# Patient Record
Sex: Female | Born: 1940 | Race: White | Hispanic: No | Marital: Married | Smoking: Never smoker
Health system: Southern US, Community
[De-identification: ages and names within clinical notes are randomized; demographics above are authoritative.]

## PROBLEM LIST (undated history)

## (undated) DIAGNOSIS — M8580 Other specified disorders of bone density and structure, unspecified site: Secondary | ICD-10-CM

## (undated) DIAGNOSIS — E785 Hyperlipidemia, unspecified: Secondary | ICD-10-CM

## (undated) DIAGNOSIS — E079 Disorder of thyroid, unspecified: Secondary | ICD-10-CM

## (undated) DIAGNOSIS — T7840XA Allergy, unspecified, initial encounter: Secondary | ICD-10-CM

## (undated) DIAGNOSIS — C801 Malignant (primary) neoplasm, unspecified: Secondary | ICD-10-CM

## (undated) DIAGNOSIS — R011 Cardiac murmur, unspecified: Secondary | ICD-10-CM

## (undated) DIAGNOSIS — Z5189 Encounter for other specified aftercare: Secondary | ICD-10-CM

## (undated) DIAGNOSIS — K219 Gastro-esophageal reflux disease without esophagitis: Secondary | ICD-10-CM

## (undated) DIAGNOSIS — I639 Cerebral infarction, unspecified: Secondary | ICD-10-CM

## (undated) DIAGNOSIS — M1990 Unspecified osteoarthritis, unspecified site: Secondary | ICD-10-CM

## (undated) DIAGNOSIS — H269 Unspecified cataract: Secondary | ICD-10-CM

## (undated) HISTORY — PX: POLYPECTOMY: SHX149

## (undated) HISTORY — DX: Hyperlipidemia, unspecified: E78.5

## (undated) HISTORY — DX: Cerebral infarction, unspecified: I63.9

## (undated) HISTORY — DX: Unspecified osteoarthritis, unspecified site: M19.90

## (undated) HISTORY — DX: Allergy, unspecified, initial encounter: T78.40XA

## (undated) HISTORY — DX: Gastro-esophageal reflux disease without esophagitis: K21.9

## (undated) HISTORY — DX: Other specified disorders of bone density and structure, unspecified site: M85.80

## (undated) HISTORY — DX: Disorder of thyroid, unspecified: E07.9

## (undated) HISTORY — PX: PARTIAL HYSTERECTOMY: SHX80

## (undated) HISTORY — DX: Malignant (primary) neoplasm, unspecified: C80.1

## (undated) HISTORY — DX: Cardiac murmur, unspecified: R01.1

## (undated) HISTORY — DX: Unspecified cataract: H26.9

## (undated) HISTORY — DX: Encounter for other specified aftercare: Z51.89

---
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
## 1967-12-18 HISTORY — PX: ABDOMINAL HYSTERECTOMY: SHX81

## 2006-07-18 ENCOUNTER — Other Ambulatory Visit: Admission: RE | Admit: 2006-07-18 | Discharge: 2006-07-18 | Payer: Self-pay | Admitting: Obstetrics and Gynecology

## 2006-07-22 ENCOUNTER — Ambulatory Visit: Payer: Self-pay | Admitting: Internal Medicine

## 2006-08-05 ENCOUNTER — Ambulatory Visit: Payer: Self-pay | Admitting: Internal Medicine

## 2006-08-05 HISTORY — PX: COLONOSCOPY: SHX174

## 2010-08-25 ENCOUNTER — Encounter: Admission: RE | Admit: 2010-08-25 | Discharge: 2010-08-25 | Payer: Self-pay | Admitting: Family Medicine

## 2010-09-05 ENCOUNTER — Encounter: Admission: RE | Admit: 2010-09-05 | Discharge: 2010-09-05 | Payer: Self-pay | Admitting: Family Medicine

## 2011-03-16 DIAGNOSIS — E039 Hypothyroidism, unspecified: Secondary | ICD-10-CM | POA: Insufficient documentation

## 2011-03-16 DIAGNOSIS — E782 Mixed hyperlipidemia: Secondary | ICD-10-CM | POA: Insufficient documentation

## 2011-08-24 DIAGNOSIS — J309 Allergic rhinitis, unspecified: Secondary | ICD-10-CM | POA: Insufficient documentation

## 2011-08-27 ENCOUNTER — Other Ambulatory Visit: Payer: Self-pay | Admitting: Family Medicine

## 2011-08-27 DIAGNOSIS — Z1231 Encounter for screening mammogram for malignant neoplasm of breast: Secondary | ICD-10-CM

## 2011-09-07 ENCOUNTER — Ambulatory Visit
Admission: RE | Admit: 2011-09-07 | Discharge: 2011-09-07 | Disposition: A | Discharge status: Home / Self Care | Payer: Medicare Other | Source: Ambulatory Visit | Attending: Family Medicine | Admitting: Family Medicine

## 2011-09-07 DIAGNOSIS — Z1231 Encounter for screening mammogram for malignant neoplasm of breast: Secondary | ICD-10-CM

## 2012-09-05 ENCOUNTER — Other Ambulatory Visit: Payer: Self-pay | Admitting: Family Medicine

## 2012-09-05 DIAGNOSIS — Z1231 Encounter for screening mammogram for malignant neoplasm of breast: Secondary | ICD-10-CM

## 2012-09-05 DIAGNOSIS — K5790 Diverticulosis of intestine, part unspecified, without perforation or abscess without bleeding: Secondary | ICD-10-CM | POA: Insufficient documentation

## 2012-09-05 DIAGNOSIS — Z780 Asymptomatic menopausal state: Secondary | ICD-10-CM

## 2012-09-24 ENCOUNTER — Ambulatory Visit
Admission: RE | Admit: 2012-09-24 | Discharge: 2012-09-24 | Disposition: A | Discharge status: Home / Self Care | Payer: Medicare Other | Source: Ambulatory Visit | Attending: Family Medicine | Admitting: Family Medicine

## 2012-09-24 DIAGNOSIS — Z1231 Encounter for screening mammogram for malignant neoplasm of breast: Secondary | ICD-10-CM

## 2012-09-24 DIAGNOSIS — Z780 Asymptomatic menopausal state: Secondary | ICD-10-CM

## 2012-10-16 DIAGNOSIS — M8580 Other specified disorders of bone density and structure, unspecified site: Secondary | ICD-10-CM | POA: Insufficient documentation

## 2013-09-30 ENCOUNTER — Other Ambulatory Visit: Payer: Self-pay | Admitting: Family Medicine

## 2013-09-30 DIAGNOSIS — Z1231 Encounter for screening mammogram for malignant neoplasm of breast: Secondary | ICD-10-CM

## 2013-09-30 DIAGNOSIS — N393 Stress incontinence (female) (male): Secondary | ICD-10-CM | POA: Insufficient documentation

## 2013-09-30 DIAGNOSIS — M159 Polyosteoarthritis, unspecified: Secondary | ICD-10-CM | POA: Insufficient documentation

## 2013-10-16 ENCOUNTER — Ambulatory Visit
Admission: RE | Admit: 2013-10-16 | Discharge: 2013-10-16 | Disposition: A | Discharge status: Home / Self Care | Payer: Medicare Other | Source: Ambulatory Visit | Attending: Family Medicine | Admitting: Family Medicine

## 2013-10-16 DIAGNOSIS — Z1231 Encounter for screening mammogram for malignant neoplasm of breast: Secondary | ICD-10-CM

## 2014-11-08 ENCOUNTER — Other Ambulatory Visit: Payer: Self-pay | Admitting: Family Medicine

## 2014-11-08 DIAGNOSIS — M810 Age-related osteoporosis without current pathological fracture: Secondary | ICD-10-CM

## 2014-11-08 DIAGNOSIS — Z1231 Encounter for screening mammogram for malignant neoplasm of breast: Secondary | ICD-10-CM

## 2014-11-08 DIAGNOSIS — N951 Menopausal and female climacteric states: Secondary | ICD-10-CM | POA: Insufficient documentation

## 2014-11-08 DIAGNOSIS — M8580 Other specified disorders of bone density and structure, unspecified site: Secondary | ICD-10-CM

## 2014-11-29 ENCOUNTER — Ambulatory Visit
Admission: RE | Admit: 2014-11-29 | Discharge: 2014-11-29 | Disposition: A | Discharge status: Home / Self Care | Payer: Medicare Other | Source: Ambulatory Visit | Attending: Family Medicine | Admitting: Family Medicine

## 2014-11-29 DIAGNOSIS — Z1231 Encounter for screening mammogram for malignant neoplasm of breast: Secondary | ICD-10-CM

## 2014-11-29 DIAGNOSIS — M8580 Other specified disorders of bone density and structure, unspecified site: Secondary | ICD-10-CM

## 2015-11-08 ENCOUNTER — Other Ambulatory Visit: Payer: Self-pay

## 2015-11-08 DIAGNOSIS — Z1231 Encounter for screening mammogram for malignant neoplasm of breast: Secondary | ICD-10-CM

## 2015-12-01 ENCOUNTER — Ambulatory Visit
Admission: RE | Admit: 2015-12-01 | Discharge: 2015-12-01 | Disposition: A | Discharge status: Home / Self Care | Payer: Medicare Other | Source: Ambulatory Visit

## 2015-12-01 DIAGNOSIS — Z1231 Encounter for screening mammogram for malignant neoplasm of breast: Secondary | ICD-10-CM

## 2016-01-11 DIAGNOSIS — M26609 Unspecified temporomandibular joint disorder, unspecified side: Secondary | ICD-10-CM | POA: Insufficient documentation

## 2016-06-15 ENCOUNTER — Encounter: Payer: Self-pay | Admitting: Gastroenterology

## 2016-06-26 ENCOUNTER — Encounter: Payer: Self-pay | Admitting: Gastroenterology

## 2016-08-23 ENCOUNTER — Encounter (INDEPENDENT_AMBULATORY_CARE_PROVIDER_SITE_OTHER): Payer: Self-pay

## 2016-08-23 ENCOUNTER — Ambulatory Visit (AMBULATORY_SURGERY_CENTER): Payer: Self-pay | Admitting: *Deleted

## 2016-08-23 VITALS — Ht 59.0 in | Wt 124.0 lb

## 2016-08-23 DIAGNOSIS — Z1211 Encounter for screening for malignant neoplasm of colon: Secondary | ICD-10-CM

## 2016-08-23 MED ORDER — NA SULFATE-K SULFATE-MG SULF 17.5-3.13-1.6 GM/177ML PO SOLN: 1.0000 | Freq: Once | ORAL | 0 refills | Status: AC

## 2016-08-23 NOTE — Progress Notes (Signed)
No egg or soy allergy known to patient  No issues with past sedation with any surgeries  or procedures, no intubation problems  No diet pills per patient No home 02 use per patient  No blood thinners per patient  Pt denies issues with constipation  No A fib or A flutter   

## 2016-08-27 ENCOUNTER — Encounter: Payer: Self-pay | Admitting: Gastroenterology

## 2016-09-06 ENCOUNTER — Ambulatory Visit (AMBULATORY_SURGERY_CENTER): Payer: Medicare Other | Admitting: Gastroenterology

## 2016-09-06 ENCOUNTER — Encounter: Payer: Self-pay | Admitting: Gastroenterology

## 2016-09-06 VITALS — BP 129/67 | HR 76 | Temp 97.8°F | Resp 17 | Ht 59.0 in | Wt 124.0 lb

## 2016-09-06 DIAGNOSIS — D123 Benign neoplasm of transverse colon: Secondary | ICD-10-CM | POA: Diagnosis not present

## 2016-09-06 DIAGNOSIS — Z1211 Encounter for screening for malignant neoplasm of colon: Secondary | ICD-10-CM | POA: Diagnosis present

## 2016-09-06 MED ORDER — SODIUM CHLORIDE 0.9 % IV SOLN: 500.0000 mL | INTRAVENOUS | Status: DC

## 2016-09-06 NOTE — Progress Notes (Signed)
Called to room to assist during endoscopic procedure.  Patient ID and intended procedure confirmed with present staff. Received instructions for my participation in the procedure from the performing physician.  

## 2016-09-06 NOTE — Op Note (Signed)
[facility] Patient Name: Allison Roberson Procedure Date: 09/06/2016 11:34 AM MRN: [id number] Endoscopist: Lund. Loletha Carrow , MD Age: 75 Referring MD:  Date of Birth: May 26, 1941 Gender: Female Account #: [id number] Procedure:                Colonoscopy Indications:              Screening for colorectal malignant neoplasm (no                            polyps on 07/2006 colonoscopy) Medicines:                Monitored Anesthesia Care Procedure:                Pre-Anesthesia Assessment:                           - Prior to the procedure, a History and Physical                            was performed, and patient medications and                            allergies were reviewed. The patient's tolerance of                            previous anesthesia was also reviewed. The risks                            and benefits of the procedure and the sedation                            options and risks were discussed with the patient.                            All questions were answered, and informed consent                            was obtained. Prior Anticoagulants: The patient has                            taken no previous anticoagulant or antiplatelet                            agents. ASA Grade Assessment: II - A patient with                            mild systemic disease. After reviewing the risks                            and benefits, the patient was deemed in                            satisfactory condition to undergo the procedure.  After obtaining informed consent, the colonoscope                            was passed under direct vision. Throughout the                            procedure, the patient's blood pressure, pulse, and                            oxygen saturations were monitored continuously. The                            Model CF-HQ190L [id number]) scope was introduced                            through the anus and advanced to  the the cecum,                            identified by appendiceal orifice and ileocecal                            valve. The colonoscopy was performed with moderate                            difficulty due to multiple diverticula in the colon                            and a tortuous distal sigmoid colon. Successful                            completion of the procedure was aided by changing                            the patient to a supine position, withdrawing the                            scope and replacing with the pediatric colonoscope                            and applying abdominal pressure. The patient                            tolerated the procedure well. The quality of the                            bowel preparation was excellent. The ileocecal                            valve, appendiceal orifice, and rectum were                            photographed. The quality of the bowel preparation  was evaluated using the BBPS [facility] Bowel                            Preparation Scale) with scores of: Right Colon = 3,                            Transverse Colon = 3 and Left Colon = 2. The total                            BBPS score equals 8. The bowel preparation used was                            SUPREP. Scope In: 11:52:45 AM Scope Out: [id number] PM Scope Withdrawal Time: 0 hours 8 minutes 17 seconds  Total Procedure Duration: 0 hours 16 minutes 33 seconds  Findings:                 The perianal and digital rectal examinations were                            normal.                           A 10 x 4 mm polyp was found in the proximal                            transverse colon. The polyp was sessile. The polyp                            was removed with a hot snare. Resection and                            retrieval were complete.                           Many diverticula were found in the distal sigmoid                            colon.                            Internal hemorrhoids were found during                            retroflexion. The hemorrhoids were medium-sized and                            Grade I (internal hemorrhoids that do not prolapse).                           The exam was otherwise without abnormality. Complications:            No immediate complications. Estimated Blood Loss:     Estimated blood loss: none. Impression:               - One 10  x 4 mm polyp in the proximal transverse                            colon, removed with a hot snare. Resected and                            retrieved.                           - Diverticulosis in the distal sigmoid colon.                           - Internal hemorrhoids.                           - The examination was otherwise normal. Recommendation:           - Patient has a contact number available for                            emergencies. The signs and symptoms of potential                            delayed complications were discussed with the                            patient. Return to normal activities tomorrow.                            Written discharge instructions were provided to the                            patient.                           - Resume previous diet.                           - No aspirin, ibuprofen, naproxen, or other                            non-steroidal anti-inflammatory drugs for 5 days                            after polyp removal.                           - Await pathology results.                           - Repeat colonoscopy is recommended for                            surveillance. The colonoscopy date will be                            determined after pathology results from today's  exam become available for review. Henry L. Loletha Carrow, MD 09/06/2016 12:17:16 PM This report has been signed electronically.

## 2016-09-06 NOTE — Progress Notes (Signed)
A/ox3 pleased with MAC, report to Penny RN 

## 2016-09-06 NOTE — Patient Instructions (Signed)
YOU HAD AN ENDOSCOPIC PROCEDURE TODAY AT [facility]:   Refer to the procedure report that was given to you for any specific questions about what was found during the examination.  If the procedure report does not answer your questions, please call your gastroenterologist to clarify.  If you requested that your care partner not be given the details of your procedure findings, then the procedure report has been included in a sealed envelope for you to review at your convenience later.  YOU SHOULD EXPECT: Some feelings of bloating in the abdomen. Passage of more gas than usual.  Walking can help get rid of the air that was put into your GI tract during the procedure and reduce the bloating. If you had a lower endoscopy (such as a colonoscopy or flexible sigmoidoscopy) you may notice spotting of blood in your stool or on the toilet paper. If you underwent a bowel prep for your procedure, you may not have a normal bowel movement for a few days.  Please Note:  You might notice some irritation and congestion in your nose or some drainage.  This is from the oxygen used during your procedure.  There is no need for concern and it should clear up in a day or so.  SYMPTOMS TO REPORT IMMEDIATELY:   Following lower endoscopy (colonoscopy or flexible sigmoidoscopy):  Excessive amounts of blood in the stool  Significant tenderness or worsening of abdominal pains  Swelling of the abdomen that is new, acute  Fever of 100F or higher   Following upper endoscopy (EGD)  Vomiting of blood or coffee ground material  New chest pain or pain under the shoulder blades  Painful or persistently difficult swallowing  New shortness of breath  Fever of 100F or higher  Black, tarry-looking stools  For urgent or emergent issues, a gastroenterologist can be reached at any hour by calling [phone].   DIET:  We do recommend a small meal at first, but then you may proceed to your regular diet.  Drink  plenty of fluids but you should avoid alcoholic beverages for 24 hours.  ACTIVITY:  You should plan to take it easy for the rest of today and you should NOT DRIVE or use heavy machinery until tomorrow (because of the sedation medicines used during the test).    FOLLOW UP: Our staff will call the number listed on your records the next business day following your procedure to check on you and address any questions or concerns that you may have regarding the information given to you following your procedure. If we do not reach you, we will leave a message.  However, if you are feeling well and you are not experiencing any problems, there is no need to return our call.  We will assume that you have returned to your regular daily activities without incident.  If any biopsies were taken you will be contacted by phone or by letter within the next 1-3 weeks.  Please call us at [phone] if you have not heard about the biopsies in 3 weeks.    SIGNATURES/CONFIDENTIALITY: You and/or your care partner have signed paperwork which will be entered into your electronic medical record.  These signatures attest to the fact that that the information above on your After Visit Summary has been reviewed and is understood.  Full responsibility of the confidentiality of this discharge information lies with you and/or your care-partner.    Handouts were given to your care partner on polyps,  diverticulosis, and hemorrhoids. No aspirin, aspirin products,  ibuprofen, naproxen, advil, motrin, aleve, or other non-steroidal anti-inflammatory drugs for 5 days after polyp removal. You may resume your other current medications today. Await biopsy results. Please call if any questions or concerns.

## 2016-09-06 NOTE — Progress Notes (Signed)
No problems noted in the recovery room. maw 

## 2016-09-06 NOTE — Progress Notes (Signed)
Briefly used adult colonoscope the switched to pediatric

## 2016-09-07 ENCOUNTER — Telehealth: Payer: Self-pay

## 2016-09-07 NOTE — Telephone Encounter (Signed)
  Follow up Call-  Call back number 09/06/2016  Post procedure Call Back phone  # [phone]  Permission to leave phone message Yes  Some recent data might be hidden    Patient was called for follow up after her procedure on 09/06/2016. Patient reports that yesterday she experienced some nausea and vomiting. This morning she reports that she doesn't feel nauseated. Allison Roberson states that she has had some diarrhea this morning. I told the patient that it could be caused by the prep for the colonoscopy. I instructed Allison Roberson to call the 24 hour number should her symptoms persist. Allison Roberson did not report any pain.   Patient questions:  Do you have a fever, pain , or abdominal swelling? No. Pain Score  0 *  Have you tolerated food without any problems? No.  Have you been able to return to your normal activities? Yes.    Do you have any questions about your discharge instructions: Diet   No. Medications  No. Follow up visit  No.  Do you have questions or concerns about your Care? No.  Actions: * If pain score is 4 or above: No action needed, pain <4.

## 2016-09-14 ENCOUNTER — Encounter: Payer: Self-pay | Admitting: Gastroenterology

## 2016-10-30 ENCOUNTER — Other Ambulatory Visit: Payer: Self-pay | Admitting: Family Medicine

## 2016-10-30 DIAGNOSIS — Z1231 Encounter for screening mammogram for malignant neoplasm of breast: Secondary | ICD-10-CM

## 2016-12-05 ENCOUNTER — Ambulatory Visit
Admission: RE | Admit: 2016-12-05 | Discharge: 2016-12-05 | Disposition: A | Discharge status: Home / Self Care | Payer: Medicare Other | Source: Ambulatory Visit | Attending: Family Medicine | Admitting: Family Medicine

## 2016-12-05 DIAGNOSIS — Z1231 Encounter for screening mammogram for malignant neoplasm of breast: Secondary | ICD-10-CM

## 2017-02-14 DIAGNOSIS — R030 Elevated blood-pressure reading, without diagnosis of hypertension: Secondary | ICD-10-CM | POA: Insufficient documentation

## 2017-10-22 ENCOUNTER — Ambulatory Visit: Payer: Medicare Other | Admitting: Nurse Practitioner

## 2017-10-22 ENCOUNTER — Encounter: Payer: Self-pay | Admitting: Nurse Practitioner

## 2017-10-22 VITALS — BP 140/62 | HR 68 | Ht 59.0 in | Wt 123.6 lb

## 2017-10-22 DIAGNOSIS — R1310 Dysphagia, unspecified: Secondary | ICD-10-CM | POA: Diagnosis not present

## 2017-10-22 NOTE — Progress Notes (Signed)
Chief Complaint : Epigastric discomfort, chokes on food  HPI:  Patient is 76 yo female previously followed by Dr. Olevia Perches now Dr. Loletha Carrow. Over the last year patient has felt like food is sticking in epigastrium. Symptom occurs within 5 or so minutes into a meal and happens with almost every meal regardless of what she eats. Feels like she is choking on food at time. Liquids seem like they help food to pass and liquids alone go down okay but she can feel them going down the esophagus. Never been evaluated for this problems. No previous EGD. She has no heartburn but does have occasion reflux. No chest pain or shortness of breath. No weight loss. She otherwise feels okay.    Past Medical History:  Diagnosis Date  . Allergy   . Arthritis    hands  . Blood transfusion without reported diagnosis 1963   with child birth  . Cancer [facility])    uterine cancer - age 41   . Cataract    small, watching   . GERD (gastroesophageal reflux disease)   . Hyperlipidemia   . Osteopenia    gets prolia every 6 months   . Thyroid disease     Past Surgical History:  Procedure Laterality Date  . COLONOSCOPY  08/05/2006   tic, moderate   . PARTIAL HYSTERECTOMY     has ovaries   Family History  Problem Relation Age of Onset  . Colon polyps Son   . Colon cancer Neg Hx   . Rectal cancer Neg Hx   . Stomach cancer Neg Hx    Social History   Tobacco Use  . Smoking status: Never Smoker  . Smokeless tobacco: Never Used  Substance Use Topics  . Alcohol use: No  . Drug use: No   Current Outpatient Medications  Medication Sig Dispense Refill  . Ascorbic Acid (VITAMIN C) 1000 MG tablet Take by mouth.    . Cholecalciferol (VITAMIN D3) 400 units CAPS Take by mouth.    . denosumab (PROLIA) 60 MG/ML SOLN injection Inject into the skin.    . folic acid (FOLVITE) 825 MCG tablet Take by mouth.    . Garlic 0539 MG CAPS Take by mouth.    . levothyroxine (SYNTHROID, LEVOTHROID) 50 MCG tablet take 1 tablet  by mouth once daily    . Multiple Vitamins-Minerals (CENTRUM SILVER) tablet Take by mouth.    . Omega-3 Fatty Acids (FISH OIL) 1000 MG CAPS Take 1,000 mg by mouth daily.    . simvastatin (ZOCOR) 40 MG tablet take 1 tablet by mouth at bedtime    . vitamin E 400 UNIT capsule Take by mouth.    . Glucosamine-Chondroitin 250-200 MG CAPS Take by mouth.     Current Facility-Administered Medications  Medication Dose Route Frequency Provider Last Rate Last Dose  . 0.9 %  sodium chloride infusion  500 mL Intravenous Continuous Danis, Estill Cotta III, MD       No Known Allergies   Physical Exam: BP 140/62   Pulse 68   Ht 4\' 11"  (1.499 m)   Wt 123 lb 9.6 oz (56.1 kg)   BMI 24.96 kg/m  Constitutional:  Well-developed, white female in no acute distress. Psychiatric: Normal mood and affect. Behavior is normal. EENT: Pupils normal.  Conjunctivae are normal. No scleral icterus. Neck supple.  Cardiovascular: Normal rate, regular rhythm. No edema Pulmonary/chest: Effort normal and breath sounds normal. No wheezing, rales or rhonchi. Abdominal: Soft, nondistended. Nontender. Bowel sounds  active throughout. There are no masses palpable. No hepatomegaly. Lymphadenopathy: No cervical adenopathy noted. Neurological: Alert and oriented to person place and time. Skin: Skin is warm and dry. No rashes noted.   ASSESSMENT AND PLAN:  76 yo female with one year hx of sensation that food is sticking in the epigastrium. She could have an esophageal stricture.  -Will start with a barium swallow with tablet. Depending on results she may need an EGD with dilation. -Advised patient to eat small bites, chew well with liquids in between bites to avoid food impaction.     Tye Savoy, NP  10/22/2017, 2:31 PM

## 2017-10-22 NOTE — Patient Instructions (Signed)
If you are age 74 or older, your body mass index should be between 23-30. Your Body mass index is 24.96 kg/m. If this is out of the aforementioned range listed, please consider follow up with your Primary Care Provider.  If you are age 52 or younger, your body mass index should be between 19-25. Your Body mass index is 24.96 kg/m. If this is out of the aformentioned range listed, please consider follow up with your Primary Care Provider.   You have been scheduled for a Barium Esophogram at [facility] (1st floor of the hospital) on 10/29/17 at 130 pm. Please arrive 15 minutes prior to your appointment for registration. Make certain not to have anything to eat or drink 3 hours prior to your test. If you need to reschedule for any reason, please contact radiology at [phone] to do so. __________________________________________________________________ A barium swallow is an examination that concentrates on views of the esophagus. This tends to be a double contrast exam (barium and two liquids which, when combined, create a gas to distend the wall of the oesophagus) or single contrast (non-ionic iodine based). The study is usually tailored to your symptoms so a good history is essential. Attention is paid during the study to the form, structure and configuration of the esophagus, looking for functional disorders (such as aspiration, dysphagia, achalasia, motility and reflux) EXAMINATION You may be asked to change into a gown, depending on the type of swallow being performed. A radiologist and radiographer will perform the procedure. The radiologist will advise you of the type of contrast selected for your procedure and direct you during the exam. You will be asked to stand, sit or lie in several different positions and to hold a small amount of fluid in your mouth before being asked to swallow while the imaging is performed .In some instances you may be asked to swallow barium coated  marshmallows to assess the motility of a solid food bolus. The exam can be recorded as a digital or video fluoroscopy procedure. POST PROCEDURE It will take 1-2 days for the barium to pass through your system. To facilitate this, it is important, unless otherwise directed, to increase your fluids for the next 24-48hrs and to resume your normal diet.  This test typically takes about 30 minutes to perform. __________________________________________________________________________________ Will call with results.  Thank you for choosing me and [facility].   Tye Savoy, NP

## 2017-10-22 NOTE — Progress Notes (Signed)
Thank you for sending this case to me. I have reviewed the entire note, and the outlined plan seems appropriate.   Henry Danis, MD  

## 2017-10-29 ENCOUNTER — Ambulatory Visit (HOSPITAL_COMMUNITY)
Admission: RE | Admit: 2017-10-29 | Discharge: 2017-10-29 | Disposition: A | Discharge status: Home / Self Care | Payer: Medicare Other | Source: Ambulatory Visit | Attending: Nurse Practitioner | Admitting: Nurse Practitioner

## 2017-10-29 DIAGNOSIS — K449 Diaphragmatic hernia without obstruction or gangrene: Secondary | ICD-10-CM | POA: Insufficient documentation

## 2017-10-29 DIAGNOSIS — R1310 Dysphagia, unspecified: Secondary | ICD-10-CM | POA: Insufficient documentation

## 2017-10-29 DIAGNOSIS — K219 Gastro-esophageal reflux disease without esophagitis: Secondary | ICD-10-CM | POA: Insufficient documentation

## 2017-11-06 DIAGNOSIS — Z8542 Personal history of malignant neoplasm of other parts of uterus: Secondary | ICD-10-CM | POA: Insufficient documentation

## 2017-11-12 ENCOUNTER — Ambulatory Visit (AMBULATORY_SURGERY_CENTER): Payer: Self-pay

## 2017-11-12 ENCOUNTER — Other Ambulatory Visit: Payer: Self-pay

## 2017-11-12 VITALS — Ht 59.0 in | Wt 120.6 lb

## 2017-11-12 DIAGNOSIS — R1310 Dysphagia, unspecified: Secondary | ICD-10-CM

## 2017-11-12 NOTE — Progress Notes (Signed)
Denies allergies to eggs or soy products. Denies complication of anesthesia or sedation. Denies use of weight loss medication. Denies use of O2.   Emmi instructions declined.  

## 2017-11-13 ENCOUNTER — Encounter: Payer: Self-pay | Admitting: Gastroenterology

## 2017-11-22 ENCOUNTER — Other Ambulatory Visit: Payer: Self-pay

## 2017-11-22 ENCOUNTER — Ambulatory Visit (AMBULATORY_SURGERY_CENTER): Payer: Medicare Other | Admitting: Gastroenterology

## 2017-11-22 ENCOUNTER — Encounter: Payer: Self-pay | Admitting: Gastroenterology

## 2017-11-22 VITALS — BP 118/68 | HR 94 | Temp 97.3°F | Resp 14 | Ht 59.0 in | Wt 123.0 lb

## 2017-11-22 DIAGNOSIS — R1310 Dysphagia, unspecified: Secondary | ICD-10-CM | POA: Diagnosis present

## 2017-11-22 DIAGNOSIS — K222 Esophageal obstruction: Secondary | ICD-10-CM

## 2017-11-22 MED ORDER — SODIUM CHLORIDE 0.9 % IV SOLN: 500.0000 mL | Freq: Once | INTRAVENOUS | Status: DC

## 2017-11-22 NOTE — Progress Notes (Signed)
To recovery, report to RN, VSS. 

## 2017-11-22 NOTE — Patient Instructions (Signed)
YOU HAD AN ENDOSCOPIC PROCEDURE TODAY AT [facility]:   Refer to the procedure report that was given to you for any specific questions about what was found during the examination.  If the procedure report does not answer your questions, please call your gastroenterologist to clarify.  If you requested that your care partner not be given the details of your procedure findings, then the procedure report has been included in a sealed envelope for you to review at your convenience later.  YOU SHOULD EXPECT: Some feelings of bloating in the abdomen. Passage of more gas than usual.  Walking can help get rid of the air that was put into your GI tract during the procedure and reduce the bloating.   Please Note:  You might notice some irritation and congestion in your nose or some drainage.  This is from the oxygen used during your procedure.  There is no need for concern and it should clear up in a day or so.  SYMPTOMS TO REPORT IMMEDIATELY:    Following upper endoscopy (EGD)  Vomiting of blood or coffee ground material  New chest pain or pain under the shoulder blades  Painful or persistently difficult swallowing  New shortness of breath  Fever of 100F or higher  Black, tarry-looking stools  For urgent or emergent issues, a gastroenterologist can be reached at any hour by calling [phone].   DIET:  We do recommend clear liquids for an hour, and then a soft diet for the rest of the day. You may have a regular diet tomorrow.  Drink plenty of fluids but you should avoid alcoholic beverages for 24 hours.  ACTIVITY:  You should plan to take it easy for the rest of today and you should NOT DRIVE or use heavy machinery until tomorrow (because of the sedation medicines used during the test).    FOLLOW UP: Our staff will call the number listed on your records the next business day following your procedure to check on you and address any questions or concerns that you may have  regarding the information given to you following your procedure. If we do not reach you, we will leave a message.  However, if you are feeling well and you are not experiencing any problems, there is no need to return our call.  We will assume that you have returned to your regular daily activities without incident.   SIGNATURES/CONFIDENTIALITY: You and/or your care partner have signed paperwork which will be entered into your electronic medical record.  These signatures attest to the fact that that the information above on your After Visit Summary has been reviewed and is understood.  Full responsibility of the confidentiality of this discharge information lies with you and/or your care-partner.  Read all of the handouts given to you by your recovery room nurse.

## 2017-11-22 NOTE — Progress Notes (Signed)
Called to room to assist during endoscopic procedure.  Patient ID and intended procedure confirmed with present staff. Received instructions for my participation in the procedure from the performing physician.  

## 2017-11-22 NOTE — Op Note (Signed)
[facility] Patient Name: Cadience Bradfield Procedure Date: 11/22/2017 1:46 PM MRN: [id number] Endoscopist: Dufur. Loletha Carrow , MD Age: 76 Referring MD:  Date of Birth: [birth year] Gender: Female Account #: [id number] Procedure:                Upper GI endoscopy Indications:              Dysphagia Medicines:                Monitored Anesthesia Care Procedure:                Pre-Anesthesia Assessment:                           - Prior to the procedure, a History and Physical                            was performed, and patient medications and                            allergies were reviewed. The patient's tolerance of                            previous anesthesia was also reviewed. The risks                            and benefits of the procedure and the sedation                            options and risks were discussed with the patient.                            All questions were answered, and informed consent                            was obtained. Prior Anticoagulants: The patient has                            taken no previous anticoagulant or antiplatelet                            agents. ASA Grade Assessment: II - A patient with                            mild systemic disease. After reviewing the risks                            and benefits, the patient was deemed in                            satisfactory condition to undergo the procedure.                           After obtaining informed consent, the endoscope was  passed under direct vision. Throughout the                            procedure, the patient's blood pressure, pulse, and                            oxygen saturations were monitored continuously. The                            Endoscope was introduced through the mouth, and                            advanced to the second part of duodenum. The upper                            GI endoscopy was accomplished without difficulty.                             The patient tolerated the procedure well. Scope In: Scope Out: Findings:                 A small hiatal hernia was present.                           - There was moderate resistance passing the scope                            through the UES, indicative of hypertensive                            sphincter. No stricture was seen in the proximal                            esophagus.                           One moderate benign-appearing, intrinsic stenosis                            was found at the gastroesophageal junction. This                            measured 1.1 cm (inner diameter) x less than one cm                            (in length) and was traversed. A guidewire was                            placed and the scope was withdrawn. Dilation was                            performed with a Savary dilator with mild  resistance at 12 mm and moderate resistance at 15                            mm. Repeat esophagoscopy confirmed good response to                            dilation.                           The stomach was normal.                           The cardia and gastric fundus were normal on                            retroflexion.                           The examined duodenum was normal. Complications:            No immediate complications. Estimated Blood Loss:     Estimated blood loss was minimal. Impression:               - Normal larynx.                           - Small hiatal hernia.                           - Benign-appearing esophageal stenosis. Dilated.                           - Normal stomach.                           - Normal examined duodenum.                           - No specimens collected. Recommendation:           - Patient has a contact number available for                            emergencies. The signs and symptoms of potential                            delayed complications were  discussed with the                            patient. Return to normal activities tomorrow.                            Written discharge instructions were provided to the                            patient.                           - Resume previous  diet.                           - Continue present medications. Jaysen Wey L. Loletha Carrow, MD 11/22/2017 2:22:53 PM This report has been signed electronically.

## 2017-11-22 NOTE — Progress Notes (Signed)
Pt's states no medical or surgical changes since previsit or office visit. 

## 2017-11-27 ENCOUNTER — Telehealth: Payer: Self-pay | Admitting: *Deleted

## 2017-11-27 NOTE — Telephone Encounter (Signed)
  Follow up Call-  Call back number 11/22/2017 09/06/2016  Post procedure Call Back phone  # [phone]  Permission to leave phone message Yes Yes  Some recent data might be hidden     Patient questions:  Do you have a fever, pain , or abdominal swelling? No. Pain Score  0 *  Have you tolerated food without any problems? Yes.    Have you been able to return to your normal activities? Yes.    Do you have any questions about your discharge instructions: Diet   No. Medications  No. Follow up visit  No.  Do you have questions or concerns about your Care? No.  Actions: * If pain score is 4 or above: No action needed, pain <4.  PT C/O SORE THROAT BUT SHE STATES ITS GETTING BETTER. ENCOURAGED PT TO CALL us BACK IF IT WORSENS.

## 2018-01-20 ENCOUNTER — Other Ambulatory Visit: Payer: Self-pay | Admitting: Family Medicine

## 2018-01-20 DIAGNOSIS — Z139 Encounter for screening, unspecified: Secondary | ICD-10-CM

## 2018-02-07 ENCOUNTER — Ambulatory Visit
Admission: RE | Admit: 2018-02-07 | Discharge: 2018-02-07 | Disposition: A | Discharge status: Home / Self Care | Payer: Medicare Other | Source: Ambulatory Visit | Attending: Family Medicine | Admitting: Family Medicine

## 2018-02-07 DIAGNOSIS — Z139 Encounter for screening, unspecified: Secondary | ICD-10-CM

## 2018-02-17 ENCOUNTER — Ambulatory Visit (INDEPENDENT_AMBULATORY_CARE_PROVIDER_SITE_OTHER): Payer: Medicare Other | Admitting: Family Medicine

## 2018-02-17 ENCOUNTER — Encounter: Payer: Self-pay | Admitting: Family Medicine

## 2018-02-17 VITALS — BP 124/82 | HR 84 | Temp 98.1°F | Resp 12 | Ht 59.0 in | Wt 119.1 lb

## 2018-02-17 DIAGNOSIS — R1310 Dysphagia, unspecified: Secondary | ICD-10-CM

## 2018-02-17 DIAGNOSIS — E039 Hypothyroidism, unspecified: Secondary | ICD-10-CM | POA: Diagnosis not present

## 2018-02-17 DIAGNOSIS — M85852 Other specified disorders of bone density and structure, left thigh: Secondary | ICD-10-CM | POA: Diagnosis not present

## 2018-02-17 DIAGNOSIS — E782 Mixed hyperlipidemia: Secondary | ICD-10-CM

## 2018-02-17 DIAGNOSIS — Z5181 Encounter for therapeutic drug level monitoring: Secondary | ICD-10-CM

## 2018-02-17 NOTE — Assessment & Plan Note (Signed)
I recommend Nexium 20 mg twice daily, she does not want me to send a prescription because she can get it cheaper OTC. GERD precautions to continue. Recommend following with GI, Dr. Marlou Sa, if symptoms are not improved in 4-6 weeks.

## 2018-02-17 NOTE — Progress Notes (Signed)
HPI:   [name] is a 77 y.o. female, who is here today to establish care.  Former PCP: Dr Allison Roberson Last preventive routine visit: 01/2017  Chronic medical problems: Hypothyroidism, osteoporosis, hyperlipidemia, and pre-hypertension among some.  She lives with her husband. She exercises regularly and follows a healthy diet.  Concerns today: She is due for her Prolia today.  She has been on biphosphonate in the past. She is now on Prolia and has been taking it for about 5 years. Before Prolia was started she had osteoporosis of left hip, -2.3. She is reporting last DEXA in 2018 at her gynecologist office. I see DEXA in 2015, osteopenia of left hip.   Comprehensive metabolic panel on 0/12/6008 Noted hyperCa++  Comprehensive metabolic panel  Component Value Ref Range Performed At  Glucose 84 65 - 99 mg/dL [facility]  BUN 10 8 - 27 mg/dL [facility]  Creatinine, Serum 0.88 0.57 - 1.00 mg/dL [facility]  eGFR If NonAfrican American 65 >59 mL/min/1.73 [facility]  eGFR If African American 75 >59 mL/min/1.73 [facility]  BUN/Creatinine Ratio 11 (L) 12 - 28 [facility]  Sodium 143 134 - 144 mmol/L [facility]  Potassium 3.7 3.5 - 5.2 mmol/L [facility]  Chloride 98 96 - 106 mmol/L [facility]  CO2 25 18 - 29 mmol/L [facility]  CALCIUM 10.6 (H) 8.7 - 10.3 mg/dL [facility]  Total Protein 8.1 6.0 - 8.5 g/dL [facility]  Albumin, Serum 5.0 (H) 3.5 - 4.8 g/dL [facility]  Globulin, Total 3.1 1.5 - 4.5 g/dL [facility]  Albumin/Globulin Ratio 1.6 1.2 - 2.2 [facility]  Total Bilirubin 0.8 0.0 - 1.2 mg/dL [facility]  Alkaline Phosphatase 65 39 - 117 IU/L [facility]  AST 25 0 - 40 IU/L [facility]  ALT (SGPT) 18       Hypothyroidism:  Currently she is on Levothyroxine 50 mcg daily.. Tolerating medication well, no side effects reported. She has not noted palpitations, abdominal pain, changes in bowel habits, tremor, cold/heat intolerance, or abnormal weight loss.  Mild wt gain.  Last TSH in 01/2017  was normal at 2.6.   Dysphagia,GERD:  She is also reporting dysphagia for solids and liquids. She follows with GI, Dr. Marlou Roberson.  EGD 11/2017 show a small hiatal hernia, intrinsic stenosis at the gastroesophageal junction.   Esophageal dilation was performed, she is reporting not improvement of dysphagia. Currently she is not on PPI treatment. She takes Rolaids for heartburn.   Denies abdominal pain, nausea, vomiting, changes in bowel habits, blood in stool or melena.   Hyperlipidemia:  Currently on Simvastatin 40 mg daily. Following a low fat diet: Yes.  She has noted lower extremity achy muscles, mainly thighs. It improved when she decreased Simvastatin dose to 20 mg but not resolved. She denies edema or erythema.  Lipid panel on 01/18/2017 Lipid panel  Component Value Ref Range Performed At  Cholesterol, Total 189 100 - 199 mg/dL [facility]  Triglycerides 189 (H) 0 - 149 mg/dL [facility]  HDL 68 >39 mg/dL [facility]  VLDL Cholesterol Cal 38 5 - 40 mg/dL [facility]  LDL Calculated 83       Review of Systems  Constitutional: Negative for activity change, appetite change, fatigue and fever.  HENT: Positive for trouble swallowing. Negative for mouth sores, nosebleeds and sore throat.   Eyes: Negative for redness and visual disturbance.  Respiratory: Negative for cough, shortness of breath and wheezing.  Cardiovascular: Negative for chest pain, palpitations and leg swelling.  Gastrointestinal: Negative for abdominal pain, blood in stool, nausea and vomiting.       Negative for changes in bowel habits.  Endocrine: Negative for cold intolerance and heat intolerance.  Genitourinary: Negative for decreased urine volume, dysuria and hematuria.  Musculoskeletal: Positive for myalgias. Negative for gait problem.  Skin: Negative for rash.  Allergic/Immunologic: Positive for environmental allergies.  Neurological: Negative for syncope, weakness and headaches.  Hematological: Negative  for adenopathy. Does not bruise/bleed easily.  Psychiatric/Behavioral: Negative for confusion. The patient is not nervous/anxious.       Current Outpatient Medications on File Prior to Visit  Medication Sig Dispense Refill  . Ascorbic Acid (VITAMIN C) 1000 MG tablet Take by mouth.    . Cholecalciferol (VITAMIN D3) 400 units CAPS Take by mouth.    . denosumab (PROLIA) 60 MG/ML SOLN injection Inject into the skin.    . folic acid (FOLVITE) 170 MCG tablet Take by mouth.    . Garlic 0174 MG CAPS Take by mouth.    . Glucosamine-Chondroitin 250-200 MG CAPS Take by mouth.    . levothyroxine (SYNTHROID, LEVOTHROID) 50 MCG tablet take 1 tablet by mouth once daily    . Multiple Vitamins-Minerals (CENTRUM SILVER) tablet Take by mouth.    . Omega-3 Fatty Acids (FISH OIL) 1000 MG CAPS Take 1,000 mg by mouth daily.    . vitamin E 400 UNIT capsule Take by mouth.     Current Facility-Administered Medications on File Prior to Visit  Medication Dose Route Frequency Provider Last Rate Last Dose  . 0.9 %  sodium chloride infusion  500 mL Intravenous Continuous Danis, Allison L III, MD      . 0.9 %  sodium chloride infusion  500 mL Intravenous Once Allison Stabler, MD         Past Medical History:  Diagnosis Date  . Allergy   . Arthritis    hands  . Blood transfusion without reported diagnosis 1963   with child birth  . Cancer [facility])    uterine cancer - age 45   . Cataract    small, watching   . GERD (gastroesophageal reflux disease)   . Hyperlipidemia   . Osteopenia    gets prolia every 6 months   . Thyroid disease    No Known Allergies  Family History  Problem Relation Age of Onset  . Colon polyps Son   . Colon cancer Neg Hx   . Rectal cancer Neg Hx   . Stomach cancer Neg Hx   . Pancreatic cancer Neg Hx     Social History   Socioeconomic History  . Marital status: Married    Spouse name: None  . Number of children: 2  . Years of education: None  . Highest education level:  None  Social Needs  . Financial resource strain: None  . Food insecurity - worry: None  . Food insecurity - inability: None  . Transportation needs - medical: None  . Transportation needs - non-medical: None  Occupational History  . None  Tobacco Use  . Smoking status: Never Smoker  . Smokeless tobacco: Never Used  Substance and Sexual Activity  . Alcohol use: No  . Drug use: No  . Sexual activity: No  Other Topics Concern  . None  Social History Narrative  . None    Vitals:   02/17/18 1513  BP: 124/82  Pulse: 84  Resp: 12  Temp:  98.1 F (36.7 C)  SpO2: 99%    Body mass index is 24.06 kg/m.   Physical Exam  Nursing note and vitals reviewed. Constitutional: She is oriented to person, place, and time. She appears well-developed and well-nourished. No distress.  HENT:  Head: Normocephalic and atraumatic.  Mouth/Throat: Oropharynx is clear and moist and mucous membranes are normal.  Eyes: Conjunctivae are normal. Pupils are equal, round, and reactive to light.  Neck: No tracheal deviation present. No thyroid mass and no thyromegaly present.  Cardiovascular: Normal rate and regular rhythm.  No murmur heard. Pulses:      Dorsalis pedis pulses are 2+ on the right side, and 2+ on the left side.  Respiratory: Effort normal and breath sounds normal. No respiratory distress.  GI: Soft. She exhibits no mass. There is no hepatomegaly. There is no tenderness.  Musculoskeletal: She exhibits no edema or tenderness.  Lymphadenopathy:    She has no cervical adenopathy.  Neurological: She is alert and oriented to person, place, and time. She has normal strength. Gait normal.  Skin: Skin is warm. No erythema.  Psychiatric: Her mood appears anxious.  Well groomed, good eye contact.     ASSESSMENT AND PLAN:   Ms. Mirren was seen today for establish care.  Orders Placed This Encounter  Procedures  . Basic metabolic panel  . TSH  . VITAMIN D 25 Hydroxy (Vit-D Deficiency,  Fractures)  . Parathyroid hormone, intact (no Ca)  . CBC with Differential/Platelet  . Calcium, ionized    Osteopenia She has responded well to Prolia. She is due for Prolia today, she requested injection but I prefer to wait until we have lab results. Continue calcium and vitamin D supplementation. Fall prevention. She is due for DEXA in 2020.   Mixed hyperlipidemia Because Simvastatin is causing myalgias of lower extremities, recommend discontinuing for now. We will consider other statin depending of lab results done today. Options: Pravastatin, Lovastatin, or Crestor. Continue low-fat diet.  Acquired hypothyroidism No changes in current management, will follow [place] done today and will give further recommendations accordingly. Follow-up in 6-12 months.   Dysphagia I recommend Nexium 20 mg twice daily, she does not want me to send a prescription because she can get it cheaper OTC. GERD precautions to continue. Recommend following with GI, Dr. Marlou Roberson, if symptoms are not improved in 4-6 weeks.     Medication monitoring encounter -     Basic metabolic panel -     VITAMIN D 25 Hydroxy (Vit-D Deficiency, Fractures) -     Parathyroid hormone, intact (no Ca)  Hypercalcemia  Further recommendations will be given according to lab results.        Shernita Rabinovich G. Martinique, MD  [facility]. [place] office.

## 2018-02-17 NOTE — Patient Instructions (Addendum)
A few things to remember from today's visit:   Acquired hypothyroidism - Plan: TSH, VITAMIN D 25 Hydroxy (Vit-D Deficiency, Fractures), Parathyroid hormone, intact (no Ca)  Dysphagia, unspecified type  Medication monitoring encounter - Plan: Basic metabolic panel, VITAMIN D 25 Hydroxy (Vit-D Deficiency, Fractures), Parathyroid hormone, intact (no Ca)  Nexium 20 mg 2 times per day recommended. We will need to schedule Prolia injection, somebody will call you in a few days.  Continue calcium and vitamin D.  Please follow with gastroenterologist if dysphagia continues.  For now we will stop simvastatin and we can try a different medication depending on cholesterol results.  Please be sure medication list is accurate. If a new problem present, please set up appointment sooner than planned today.

## 2018-02-17 NOTE — Assessment & Plan Note (Signed)
She has responded well to Prolia. She is due for Prolia today, she requested injection but I prefer to wait until we have lab results. Continue calcium and vitamin D supplementation. Fall prevention. She is due for DEXA in 2020.

## 2018-02-17 NOTE — Assessment & Plan Note (Signed)
No changes in current management, will follow [place] done today and will give further recommendations accordingly. Follow-up in 6-12 months.

## 2018-02-17 NOTE — Assessment & Plan Note (Signed)
Because Simvastatin is causing myalgias of lower extremities, recommend discontinuing for now. We will consider other statin depending of lab results done today. Options: Pravastatin, Lovastatin, or Crestor. Continue low-fat diet.

## 2018-02-18 LAB — CBC WITH DIFFERENTIAL/PLATELET
BASOS ABS: 0.1 10*3/uL (ref 0.0–0.1)
Basophils Relative: 1.1 % (ref 0.0–3.0)
Eosinophils Absolute: 0.2 10*3/uL (ref 0.0–0.7)
Eosinophils Relative: 2.4 % (ref 0.0–5.0)
HEMATOCRIT: 45.5 % (ref 36.0–46.0)
HEMOGLOBIN: 15.6 g/dL — AB (ref 12.0–15.0)
LYMPHS PCT: 45.6 % (ref 12.0–46.0)
Lymphs Abs: 3.9 10*3/uL (ref 0.7–4.0)
MCHC: 34.4 g/dL (ref 30.0–36.0)
MCV: 94.2 fl (ref 78.0–100.0)
MONOS PCT: 11 % (ref 3.0–12.0)
Monocytes Absolute: 0.9 10*3/uL (ref 0.1–1.0)
NEUTROS ABS: 3.4 10*3/uL (ref 1.4–7.7)
Neutrophils Relative %: 39.9 % — ABNORMAL LOW (ref 43.0–77.0)
Platelets: 197 10*3/uL (ref 150.0–400.0)
RBC: 4.83 Mil/uL (ref 3.87–5.11)
RDW: 13.2 % (ref 11.5–15.5)
WBC: 8.5 10*3/uL (ref 4.0–10.5)

## 2018-02-18 LAB — PARATHYROID HORMONE, INTACT (NO CA): PTH: 24 pg/mL (ref 14–64)

## 2018-02-18 LAB — VITAMIN D 25 HYDROXY (VIT D DEFICIENCY, FRACTURES): VITD: 58.02 ng/mL (ref 30.00–100.00)

## 2018-02-18 LAB — BASIC METABOLIC PANEL
BUN: 11 mg/dL (ref 6–23)
CHLORIDE: 100 meq/L (ref 96–112)
CO2: 32 meq/L (ref 19–32)
CREATININE: 0.72 mg/dL (ref 0.40–1.20)
Calcium: 10.4 mg/dL (ref 8.4–10.5)
GFR: 84.05 mL/min (ref >60.00)
Glucose, Bld: 84 mg/dL (ref 70–99)
POTASSIUM: 4.1 meq/L (ref 3.5–5.1)
Sodium: 140 mEq/L (ref 135–145)

## 2018-02-18 LAB — TSH: TSH: 2.35 u[IU]/mL (ref 0.35–4.50)

## 2018-02-18 LAB — CALCIUM, IONIZED: Calcium, Ion: 5.3 mg/dL (ref 4.8–5.6)

## 2018-02-26 ENCOUNTER — Other Ambulatory Visit: Payer: Self-pay | Admitting: Family Medicine

## 2018-02-26 NOTE — Telephone Encounter (Signed)
Copied from [place] [id number]. Topic: Quick Communication - Rx Refill/Question >> Feb 26, 2018  3:21 PM Lolita Rieger, [place] wrote: Medication: levothyroxine 50 mg,simvastatin 40 mg   Has the patient contacted their pharmacy? yes   (Agent: If no, request that the patient contact the pharmacy for the refill.)   Preferred Pharmacy (with phone number or street name): [facility] in [place]    Agent: Please be advised that RX refills may take up to 3 business days. We ask that you follow-up with your pharmacy.

## 2018-02-27 NOTE — Telephone Encounter (Signed)
Last office visit; 02/17/18 (establish care) PCP Dr. Martinique [facility]: [facility] in [place]  Pt. requesting Levothyroxine and Simvastatin. See OV of 3/4; Dr. Martinique noted plan to d/c Simvastatin, and change to different medication.

## 2018-02-28 MED ORDER — LEVOTHYROXINE SODIUM 50 MCG PO TABS: 50.0000 ug | ORAL_TABLET | Freq: Every day | ORAL | 3 refills | Status: DC

## 2018-04-02 ENCOUNTER — Ambulatory Visit (INDEPENDENT_AMBULATORY_CARE_PROVIDER_SITE_OTHER): Payer: Medicare Other

## 2018-04-02 DIAGNOSIS — M810 Age-related osteoporosis without current pathological fracture: Secondary | ICD-10-CM | POA: Diagnosis not present

## 2018-04-02 MED ORDER — DENOSUMAB 60 MG/ML ~~LOC~~ SOSY
60.0000 mg | PREFILLED_SYRINGE | Freq: Once | SUBCUTANEOUS | Status: AC
  Administered 2018-04-02: 60 mg via SUBCUTANEOUS

## 2018-10-03 ENCOUNTER — Telehealth: Payer: Self-pay

## 2018-10-03 ENCOUNTER — Ambulatory Visit: Payer: Medicare Other

## 2018-10-03 NOTE — Telephone Encounter (Signed)
Allison Roberson, DOB: 1941-12-05 came to our office today and stated that she got confirmation that her Prolia was approved from the company. I did not see her name on any of the boxes we have in the fridge. I advised to her that [name] will contact her once we receive the confirmation information. Patient is upset because she stated that the delay will not allow her to have her injections on time. I advised patient that the cost will be around $2000 if we give her the injection and do not have approval yet. I also apologized to the patient for any inconvenience. Patient verbalized an understanding and is awaiting a call to have her Prolia injection.

## 2018-10-08 NOTE — Telephone Encounter (Signed)
Appointment scheduled for Prolia injection Cost approximately $260

## 2018-10-10 ENCOUNTER — Ambulatory Visit (INDEPENDENT_AMBULATORY_CARE_PROVIDER_SITE_OTHER): Payer: Medicare Other | Admitting: *Deleted

## 2018-10-10 DIAGNOSIS — M810 Age-related osteoporosis without current pathological fracture: Secondary | ICD-10-CM

## 2018-10-10 MED ORDER — DENOSUMAB 60 MG/ML ~~LOC~~ SOSY
60.0000 mg | PREFILLED_SYRINGE | Freq: Once | SUBCUTANEOUS | Status: AC
  Administered 2018-10-10: 60 mg via SUBCUTANEOUS

## 2018-10-10 NOTE — Progress Notes (Signed)
Per orders of Dr. Martinique, injection of Prolia, given by Agnes Lawrence. Patient tolerated injection well.

## 2019-02-02 ENCOUNTER — Other Ambulatory Visit: Payer: Self-pay | Admitting: Family Medicine

## 2019-03-18 ENCOUNTER — Telehealth: Payer: Self-pay | Admitting: *Deleted

## 2019-03-18 NOTE — Telephone Encounter (Signed)
Copied from [place] [phone]. Topic: General - Other >> Mar 17, 2019  9:00 PM Oneta Rack wrote: Patient LVM stating she's due for her prolia injection. Patient last office visit 02/17/18 with PCP. Please advise

## 2019-04-24 NOTE — Telephone Encounter (Signed)
Patient scheduled for Prolia injection 04/29/2019. Patient is aware of the cost - 20% of Prolia and $35 office fee.

## 2019-04-29 ENCOUNTER — Other Ambulatory Visit: Payer: Self-pay

## 2019-04-29 ENCOUNTER — Encounter: Payer: Self-pay | Admitting: Family Medicine

## 2019-04-29 ENCOUNTER — Ambulatory Visit (INDEPENDENT_AMBULATORY_CARE_PROVIDER_SITE_OTHER): Payer: Medicare Other | Admitting: *Deleted

## 2019-04-29 ENCOUNTER — Ambulatory Visit (INDEPENDENT_AMBULATORY_CARE_PROVIDER_SITE_OTHER): Payer: Medicare Other | Admitting: Family Medicine

## 2019-04-29 DIAGNOSIS — N390 Urinary tract infection, site not specified: Secondary | ICD-10-CM

## 2019-04-29 DIAGNOSIS — R300 Dysuria: Secondary | ICD-10-CM

## 2019-04-29 DIAGNOSIS — R350 Frequency of micturition: Secondary | ICD-10-CM

## 2019-04-29 DIAGNOSIS — M810 Age-related osteoporosis without current pathological fracture: Secondary | ICD-10-CM

## 2019-04-29 LAB — POC URINALSYSI DIPSTICK (AUTOMATED)
Blood, UA: NEGATIVE
Glucose, UA: NEGATIVE
Protein, UA: NEGATIVE
Spec Grav, UA: >=1.03 — AB (ref 1.010–1.025)
Urobilinogen, UA: 2 E.U./dL — AB
pH, UA: 5 (ref 5.0–8.0)

## 2019-04-29 MED ORDER — DENOSUMAB 60 MG/ML ~~LOC~~ SOSY
60.0000 mg | PREFILLED_SYRINGE | Freq: Once | SUBCUTANEOUS | Status: AC
  Administered 2019-04-29: 14:00:00 60 mg via SUBCUTANEOUS

## 2019-04-29 MED ORDER — SULFAMETHOXAZOLE-TRIMETHOPRIM 800-160 MG PO TABS: 1.0000 | ORAL_TABLET | Freq: Two times a day (BID) | ORAL | 0 refills | Status: AC

## 2019-04-29 NOTE — Progress Notes (Signed)
Virtual Visit via Telephone Note  I connected with Allison Roberson on 04/29/19 at  3:00 PM EDT by telephone and verified that I am speaking with the correct person using two identifiers.   I discussed the limitations, risks, security and privacy concerns of performing an evaluation and management service by telephone and the availability of in person appointments. I also discussed with the patient that there may be a patient responsible charge related to this service. The patient expressed understanding and agreed to proceed.  Location patient: home Location provider: home office Participants present for the call: patient, provider Patient did not have a visit in the prior 7 days to address this/these issue(s).   History of Present Illness: She is a 78 year old female complaining of 3 weeks of intermittent urinary symptoms. She is having dysuria, frequency, and urgency. She denies urine incontinence, gross hematuria, vaginal discharge, or vaginal bleeding.  She has had UTIs in the past, not very often. Denies fever, chills, body aches, abdominal pain, back pain, nausea, or vomiting.  She has taking OTC Azo, which has helped with dysuria. Symptoms otherwise stable.    Observations/Objective: Patient sounds cheerful and well on the phone. I do not appreciate any SOB. Speech and thought processing are grossly intact. Patient reported vitals:N/A  Assessment and Plan:  1. Frequent urination - POCT Urinalysis Dipstick (Automated) - Urine culture - sulfamethoxazole-trimethoprim (BACTRIM DS) 800-160 MG tablet; Take 1 tablet by mouth 2 (two) times daily for 5 days.  Dispense: 10 tablet; Refill: 0  2. Urinary tract infection without hematuria, site unspecified - Urine culture - sulfamethoxazole-trimethoprim (BACTRIM DS) 800-160 MG tablet; Take 1 tablet by mouth 2 (two) times daily for 5 days.  Dispense: 10 tablet; Refill: 0  Urine dipstick was done earlier today, positive for  nitrates and leukocytes. Urine also positive for ketones and urobilinogen.  She has not noted jaundice, color changes in urine or stool.  Possible causes of reported symptoms discussed. Ucx ordered.  Empiric abx treatment started today and will be tailored according to Ucx results and susceptibility report.  Clearly instructed about warning signs. F/U if symptoms persist.  Follow Up Instructions: We will follow urine culture. Planning on checking LFTs with next blood work. 02/2017 LFT in normal range.  I did not refer this patient for an OV in the next 24 hours for this/these issue(s).  I discussed the assessment and treatment plan with the patient. She was provided an opportunity to ask questions and all were answered. The patient agreed with the plan and demonstrated an understanding of the instructions.   The patient was advised to call back or seek an in-person evaluation if the symptoms worsen or if the condition fails to improve as anticipated.  I provided 15 minutes of non-face-to-face time during this encounter.   Derhonda Eastlick Martinique, MD

## 2019-04-29 NOTE — Progress Notes (Addendum)
Per orders of Dr. Hernandez, injection of Prolia given by Edris Friedt. Patient tolerated injection well. 

## 2019-04-30 LAB — URINE CULTURE
MICRO NUMBER:: 470777
SPECIMEN QUALITY:: ADEQUATE

## 2019-05-22 ENCOUNTER — Telehealth: Payer: Self-pay | Admitting: *Deleted

## 2019-05-22 NOTE — Telephone Encounter (Signed)
I saw her recently because UTI symptoms. I cannot see ER visit nor labs or imaging (if any) done during visit.  If symptoms are is stable, I will be glad to see her next week.  If she wants a referral to pulmonologist because of shortness of breath, it can be placed,even though this seems to be a new problem.  Thanks, BJ

## 2019-05-22 NOTE — Telephone Encounter (Signed)
Message sent to Dr. Jordan for review. 

## 2019-05-22 NOTE — Telephone Encounter (Signed)
Patient has not been seen by Dr. Elease Hashimoto.

## 2019-05-22 NOTE — Telephone Encounter (Signed)
Was this supposed to be routed to Dr Martinique? Dr Doug Sou patient.

## 2019-05-22 NOTE — Telephone Encounter (Signed)
Copied from [place] [phone] [phone]. Topic: General - Other >> May 22, 2019 10:06 AM Jodie Echevaria wrote: Reason for CRM: Patient called to request that Dr Elease Hashimoto call him back in reference to his wife being in the hospital for 4 days because she was having trouble breathing. He states that the hospital have ran all kinds of tests on her but cant find anything wrong with her. Asking if Dr can please get her scheduled for a Pulmonologist. Her husband states that the reason she went to the hospital was because she was unconscious and not breathing. Michela Pitcher that the family was told that patient has a slight lung infection other than that they cant find any other issues. Please call patient husband or daughter Ph# [phone] or [phone] [place]

## 2019-05-24 MED ORDER — Medication
Status: DC
Start: ? — End: 2019-05-24

## 2019-05-24 MED ORDER — BARO-CAT PO
10.00 | ORAL | Status: DC
Start: ? — End: 2019-05-24

## 2019-05-24 MED ORDER — PHENOL EX
20.00 | CUTANEOUS | Status: DC
Start: 2019-05-23 — End: 2019-05-24

## 2019-05-24 MED ORDER — CVS EAR DROPS OT
40.00 | OTIC | Status: DC
Start: 2019-05-24 — End: 2019-05-24

## 2019-05-24 MED ORDER — Medication
1.00 | Status: DC
Start: 2019-05-24 — End: 2019-05-24

## 2019-05-24 MED ORDER — GUAIFENESIN 100 MG/5ML PO LIQD
200.00 | ORAL | Status: DC
Start: ? — End: 2019-05-24

## 2019-05-24 MED ORDER — VICON FORTE PO CAPS
17.00 | ORAL_CAPSULE | ORAL | Status: DC
Start: ? — End: 2019-05-24

## 2019-05-24 MED ORDER — RA COLD/COUGH DM 15-1-5 MG/5ML PO ELIX
80.00 | ORAL_SOLUTION | ORAL | Status: DC
Start: 2019-05-23 — End: 2019-05-24

## 2019-05-24 MED ORDER — PENTOXIFYLLINE POWD
50.00 | Status: DC
Start: 2019-05-23 — End: 2019-05-24

## 2019-05-24 MED ORDER — ALBUTEROL SULFATE (2.5 MG/3ML) 0.083% IN NEBU
2.50 | INHALATION_SOLUTION | RESPIRATORY_TRACT | Status: DC
Start: ? — End: 2019-05-24

## 2019-05-24 MED ORDER — ALUM & MAG HYDROXIDE-SIMETH 200-200-20 MG/5ML PO SUSP
30.00 | ORAL | Status: DC
Start: ? — End: 2019-05-24

## 2019-05-24 MED ORDER — ASPIRIN 81 MG PO CHEW
81.00 | CHEWABLE_TABLET | ORAL | Status: DC
Start: 2019-05-24 — End: 2019-05-24

## 2019-05-24 MED ORDER — CHLOROBUTANOL-EUGENOL & APAP
0.40 | Status: DC
Start: ? — End: 2019-05-24

## 2019-05-26 NOTE — Telephone Encounter (Signed)
Spoke with patient and she stated she is feeling much better. Patient stated that she believe her symptoms were because she had pneumonia. Patient declined ov and pulmonary referral at this time. Will call office if she has any more issues.

## 2019-08-08 ENCOUNTER — Encounter: Payer: Self-pay | Admitting: Gastroenterology

## 2019-09-18 ENCOUNTER — Other Ambulatory Visit: Payer: Self-pay | Admitting: *Deleted

## 2019-09-18 ENCOUNTER — Telehealth: Payer: Self-pay | Admitting: *Deleted

## 2019-09-18 DIAGNOSIS — Z789 Other specified health status: Secondary | ICD-10-CM

## 2019-09-18 NOTE — Telephone Encounter (Signed)
Copied from [place] [phone]. Topic: Referral - Request for Referral >> Sep 17, 2019  8:33 AM Carolyn Stare wrote: Pt call to say she need a referral to Dr Delice Lesch neurologist she was in the hospital had a mild stroke

## 2019-09-21 ENCOUNTER — Encounter: Payer: Self-pay | Admitting: Neurology

## 2019-09-24 ENCOUNTER — Telehealth: Payer: Self-pay | Admitting: Family Medicine

## 2019-09-24 NOTE — Telephone Encounter (Signed)
Copied from [place] [phone]. Topic: Referral - Request for Referral >> Sep 09, 2019  9:11 AM Richardo Priest, NT wrote: Has patient seen PCP for this complaint?  No and unaware *If NO, is insurance requiring patient see PCP for this issue before PCP can refer them? Referral for which specialty: neurology Preferred provider/office: NA Reason for referral: patient went to hospital in June and had mini strokes. >> Sep 09, 2019 10:25 AM Cox, Melburn Hake, CMA wrote: Pt needs OV to discuss. Please schedule.   Patient is already scheduled for neuro on 10/14/2019 at 10:10

## 2019-10-13 NOTE — Progress Notes (Addendum)
NEUROLOGY CONSULTATION NOTE  Allison Roberson MRN: [id number] DOB: 08-May-1941  Referring provider: Betty Martinique, MD Primary care provider: Betty Martinique, MD  Reason for consult:  stroke  HISTORY OF PRESENT ILLNESS: Allison Roberson is a 78 year old right-handed white female with hyperlipidemia, TMJ dysfunction, thyroid disease and remote history of uterine cancer who presents for stroke.  History supplemented by hospital and referring provider's notes.  On 05/20/2019, she was sleeping in bed when her husband heard change in breathing.  He was unable to wake her up and he couldn't.  No associated shaking, incontinence or tongue biting.  EMS was contacted and she was brought to [facility].  In the ED, she appeared confused and did not remember the episode.  CBC, electrolytes, troponin and EKG were unremarkable.  She was admitted for further evaluation.  She had an apnea link performed which did not show any episodes of apnea but her O2 saturation dropped below 80% several times.  CT chest showed questionable atelectasis versus infiltrate in upper lobes.  She did not exhibit any fever or upper respiratory symptoms but was started on empiric IV antibiotics and Lasix.  She demonstrated no further nocturnal hypoxia.  CT of head was negative for acute intracranial abnormality.  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  No definitive diagnosis was established.  She was discharged with home oxygen and recommendation for outpatient sleep study and neurology follow up.  She has not had any recurrent events.   She takes ASA 81mg  twice a week.     PAST MEDICAL HISTORY: Past Medical History:  Diagnosis Date  . Allergy   . Arthritis    hands  . Blood transfusion without reported diagnosis 1963   with child birth  . Cancer [facility])     uterine cancer - age 26   . Cataract    small, watching   . GERD (gastroesophageal reflux disease)   . Hyperlipidemia   . Osteopenia    gets prolia every 6 months   . Thyroid disease     PAST SURGICAL HISTORY: Past Surgical History:  Procedure Laterality Date  . ABDOMINAL HYSTERECTOMY  1969  . COLONOSCOPY  08/05/2006   tic, moderate   . PARTIAL HYSTERECTOMY     has ovaries    MEDICATIONS: Current Outpatient Medications on File Prior to Visit  Medication Sig Dispense Refill  . Ascorbic Acid (VITAMIN C) 1000 MG tablet Take by mouth.    . Cholecalciferol (VITAMIN D3) 400 units CAPS Take by mouth.    . denosumab (PROLIA) 60 MG/ML SOLN injection Inject into the skin.    . folic acid (FOLVITE) A999333 MCG tablet Take by mouth.    . Garlic 123XX123 MG CAPS Take by mouth.    . Glucosamine-Chondroitin 250-200 MG CAPS Take by mouth.    . levothyroxine (SYNTHROID, LEVOTHROID) 50 MCG tablet TAKE 1 TABLET(50 MCG) BY MOUTH DAILY 90 tablet 3  . Multiple Vitamins-Minerals (CENTRUM SILVER) tablet Take by mouth.    . Omega-3 Fatty Acids (FISH OIL) 1000 MG CAPS Take 1,000 mg by mouth daily.    . vitamin E 400 UNIT capsule Take by mouth.     Current Facility-Administered Medications on File Prior to Visit  Medication Dose Route Frequency Provider Last Rate Last Dose  . 0.9 %  sodium chloride infusion  500 mL Intravenous Continuous Danis,  Estill Cotta III, MD      . 0.9 %  sodium chloride infusion  500 mL Intravenous Once Danis, Kirke Corin, MD        ALLERGIES: No Known Allergies  FAMILY HISTORY: Family History  Problem Relation Age of Onset  . Colon polyps Son   . Colon cancer Neg Hx   . Rectal cancer Neg Hx   . Stomach cancer Neg Hx   . Pancreatic cancer Neg Hx   .  SOCIAL HISTORY: Social History   Socioeconomic History  . Marital status: Married    Spouse name: Not on file  . Number of children: 2  . Years of education: Not on file  . Highest education level: Not on file   Occupational History  . Not on file  Social Needs  . Financial resource strain: Not on file  . Food insecurity    Worry: Not on file    Inability: Not on file  . Transportation needs    Medical: Not on file    Non-medical: Not on file  Tobacco Use  . Smoking status: Never Smoker  . Smokeless tobacco: Never Used  Substance and Sexual Activity  . Alcohol use: No  . Drug use: No  . Sexual activity: Never  Lifestyle  . Physical activity    Days per week: Not on file    Minutes per session: Not on file  . Stress: Not on file  Relationships  . Social Herbalist on phone: Not on file    Gets together: Not on file    Attends religious service: Not on file    Active member of club or organization: Not on file    Attends meetings of clubs or organizations: Not on file    Relationship status: Not on file  . Intimate partner violence    Fear of current or ex partner: Not on file    Emotionally abused: Not on file    Physically abused: Not on file    Forced sexual activity: Not on file  Other Topics Concern  . Not on file  Social History Narrative  . Not on file    REVIEW OF SYSTEMS: Constitutional: No fevers, chills, or sweats, no generalized fatigue, change in appetite Eyes: No visual changes, double vision, eye pain Ear, nose and throat: No hearing loss, ear pain, nasal congestion, sore throat Cardiovascular: No chest pain, palpitations Respiratory:  No shortness of breath at rest or with exertion, wheezes GastrointestinaI: No nausea, vomiting, diarrhea, abdominal pain, fecal incontinence Genitourinary:  No dysuria, urinary retention or frequency Musculoskeletal:  No neck pain, back pain Integumentary: No rash, pruritus, skin lesions Neurological: as above Psychiatric: No depression, insomnia, anxiety Endocrine: No palpitations, fatigue, diaphoresis, mood swings, change in appetite, change in weight, increased thirst Hematologic/Lymphatic:  No purpura,  petechiae. Allergic/Immunologic: no itchy/runny eyes, nasal congestion, recent allergic reactions, rashes  PHYSICAL EXAM: Blood pressure (!) 149/60, pulse 76, height 4\' 11"  (1.499 m), weight 113 lb 12.8 oz (51.6 kg), SpO2 97 %.  General: No acute distress.  Patient appears well-groomed.  Head:  Normocephalic/atraumatic Eyes:  fundi examined but not visualized Neck: supple, no paraspinal tenderness, full range of motion Back: No paraspinal tenderness Heart: regular rate and rhythm Lungs: Clear to auscultation bilaterally. Vascular: No carotid bruits. Neurological Exam: Mental status: alert and oriented to person, place, and time, recent and remote memory intact, fund of knowledge intact, attention and concentration intact, speech fluent and not dysarthric, language  intact. Cranial nerves: CN I: not tested CN II: pupils equal, round and reactive to light, visual fields intact CN III, IV, VI:  full range of motion, no nystagmus, no ptosis CN V: facial sensation intact CN VII: upper and lower face symmetric CN VIII: hearing intact CN IX, X: gag intact, uvula midline CN XI: sternocleidomastoid and trapezius muscles intact CN XII: tongue midline Bulk & Tone: normal, no fasciculations. Motor:  5/5 throughout  Sensation: temperature and vibration sensation intact. Deep Tendon Reflexes:  2+ throughout, toes downgoing.  Finger to nose testing:  Without dysmetria.  Heel to shin:  Without dysmetria.  Gait:  Normal station and stride.  Able to turn and tandem walk. Romberg negative.  IMPRESSION: 1.  Episode of hypoxia and unresponsiveness.  I don't think it was a CVA.  I would like to further evaluate with EEG. 2.  Cerebrovascular disease with findings of chronic cerebellar infarcts on brain MRI, incidental finding  PLAN: 1.  EEG 2.  Recommend taking ASA 81mg  daily and optimize stroke risk factors (blood pressure, cholesterol) 3.  Further recommendations pending EEG results.  If  unremarkable, follow up as needed.  Thank you for allowing me to take part in the care of this patient.  Metta Clines, DO  CC:  Betty Martinique, MD  ADDENDUM 11/02/2019:  Routine EEG from 10/19/2019 showed intermittent left temporal sharply-contoured slowing but no clear epileptiform discharges.  A 24 hour ambulatory EEG from 10/28/2019 to 10/29/2019 was normal.   Metta Clines, DO

## 2019-10-14 ENCOUNTER — Encounter: Payer: Self-pay | Admitting: Neurology

## 2019-10-14 ENCOUNTER — Other Ambulatory Visit: Payer: Self-pay

## 2019-10-14 ENCOUNTER — Ambulatory Visit: Payer: Medicare Other | Admitting: Neurology

## 2019-10-14 VITALS — BP 149/60 | HR 76 | Ht 59.0 in | Wt 113.8 lb

## 2019-10-14 DIAGNOSIS — R4020 Unspecified coma: Secondary | ICD-10-CM | POA: Diagnosis not present

## 2019-10-14 DIAGNOSIS — I679 Cerebrovascular disease, unspecified: Secondary | ICD-10-CM | POA: Diagnosis not present

## 2019-10-14 NOTE — Patient Instructions (Signed)
I don't know what the event was, but it was not a stroke.  The findings of stroke on MRI are old.    To further evaluate the episode in June, we will check a routine EEG.  Further recommendations pending results.  Given the evidence of old strokes on MRI, recommend taking aspirin 81mg  daily and make sure blood pressure and cholesterol are controlled.

## 2019-10-19 ENCOUNTER — Ambulatory Visit (INDEPENDENT_AMBULATORY_CARE_PROVIDER_SITE_OTHER): Payer: Medicare Other | Admitting: Neurology

## 2019-10-19 ENCOUNTER — Other Ambulatory Visit: Payer: Self-pay

## 2019-10-19 DIAGNOSIS — R4020 Unspecified coma: Secondary | ICD-10-CM | POA: Diagnosis not present

## 2019-10-22 ENCOUNTER — Telehealth: Payer: Self-pay | Admitting: Neurology

## 2019-10-22 NOTE — Telephone Encounter (Signed)
Patient called to check on EEG Results. Thank you

## 2019-10-23 ENCOUNTER — Telehealth: Payer: Self-pay

## 2019-10-23 DIAGNOSIS — R4020 Unspecified coma: Secondary | ICD-10-CM

## 2019-10-23 NOTE — Telephone Encounter (Signed)
Called spoke with patient she was informed that results are not back yet but will call once reviewed with results.

## 2019-10-23 NOTE — Telephone Encounter (Signed)
-----   Message from Pieter Partridge, DO sent at 10/23/2019 12:56 PM EST ----- EEG shows some slowing on the left side of her brain.  For a more thorough evaluation, I would like to order a 24 hour ambulatory EEG.  I would like her to make a follow up appointment with me afterwards to discuss (virtual visit is okay)

## 2019-10-23 NOTE — Procedures (Addendum)
ELECTROENCEPHALOGRAM REPORT  Date of Study: 10/19/2019  Patient's Name: Allison Roberson MRN: [id number] Date of Birth: [birth year]  Referring Provider: Metta Clines, DO  Clinical History: 78 year old woman with episode of hypoxia and unresponsiveness.  Medications: VITAMIN C 1000 MG tablet VITAMIN D3 400 units CAPS PROLIA 60 MG/ML SOLN injection FOLVITE A999333 MCG tablet Garlic 123XX123 MG CAPS SYNTHROID, LEVOTHROID 50 MCG tablet CENTRUM SILVER tablet FISH OIL 1000 MG CAPS vitamin E 400 UNIT capsule  Technical Summary: A multichannel digital EEG recording measured by the international 10-20 system with electrodes applied with paste and impedances below 5000 ohms performed in our laboratory with EKG monitoring in an awake and asleep patient.  Hyperventilation not performed as patient wearing mask for COVID pandemic.  Photic stimulation was performed.  The digital EEG was referentially recorded, reformatted, and digitally filtered in a variety of bipolar and referential montages for optimal display.    Description: The patient is awake and asleep during the recording.  During maximal wakefulness, there is a symmetric, medium voltage 11 Hz posterior dominant rhythm that attenuates with eye opening.  There is intermittent sharply contoured theta in the left temporal region without epileptiform morphology.  During drowsiness and sleep, there is an increase in theta slowing of the background.  Vertex waves and symmetric sleep spindles were seen.  Photic stimulation did not elicit any abnormalities.  There were no epileptiform discharges or electrographic seizures seen.    EKG lead was unremarkable.  Impression: This awake and asleep EEG is abnormal due to intermittent left temporal sharply-contoured slowing.  No clear epileptiform discharges seen.    Clinical Correlation: Above findings suggestive of a structural or physiologic abnormality in the left temporal region.     Metta Clines, DO

## 2019-10-23 NOTE — Telephone Encounter (Signed)
Called spoke with patient she was informed of results. Order place for AMB 24 HOUR EEG. Pt aware to make appt with Dr. Tomi Likens to f/u to discuss results at EEG has been complete.

## 2019-10-28 ENCOUNTER — Ambulatory Visit (INDEPENDENT_AMBULATORY_CARE_PROVIDER_SITE_OTHER): Payer: Medicare Other | Admitting: Neurology

## 2019-10-28 ENCOUNTER — Other Ambulatory Visit: Payer: Self-pay

## 2019-10-28 DIAGNOSIS — R4020 Unspecified coma: Secondary | ICD-10-CM

## 2019-11-02 ENCOUNTER — Telehealth: Payer: Self-pay

## 2019-11-02 NOTE — Procedures (Signed)
ELECTROENCEPHALOGRAM REPORT  Dates of Recording: 10/28/2019 at 11:18 AM to 10/29/2019 at 12:26 PM  Patient's Name: Allison Roberson MRN: [id number] Date of Birth: 17-Oct-1941  Referring Provider: Metta Clines  Procedure: 24-hour ambulatory EEG  History: 78 year old female with episode of hypoxia and unresponsiveness out of sleep.  Medications:  VITAMIN C 1000 MG tablet VITAMIN D3 400 units CAPS PROLIA 60 MG/ML SOLN injection FOLVITE A999333 MCG tablet Garlic 123XX123 MG CAPS SYNTHROID, LEVOTHROID 50 MCG tablet CENTRUM SILVER tablet FISH OIL 1000 MG CAPS vitamin E 400 UNIT capsule  Technical Summary: This is a 24-hour multichannel digital EEG recording measured by the international 10-20 system with electrodes applied with paste and impedances below 5000 ohms performed as portable with EKG monitoring.  The digital EEG was referentially recorded, reformatted, and digitally filtered in a variety of bipolar and referential montages for optimal display.    DESCRIPTION OF RECORDING: During maximal wakefulness, the background activity consisted of a symmetric 11Hz  posterior dominant rhythm which was reactive to eye opening.  There were no epileptiform discharges or focal slowing seen in wakefulness.  During the recording, the patient progresses through wakefulness, drowsiness, and Stage 2 sleep.  Again, there were no epileptiform discharges seen.  Events:  There were no electrographic seizures seen.  EKG lead was unremarkable.  IMPRESSION: This 24-hour ambulatory EEG study is normal.    CLINICAL CORRELATION: A normal EEG does not exclude a clinical diagnosis of epilepsy.  If further clinical questions remain, inpatient video EEG monitoring may be helpful.   Metta Clines, DO

## 2019-11-02 NOTE — Telephone Encounter (Signed)
Called spoke with she was informed of results. And understands

## 2019-11-02 NOTE — Telephone Encounter (Signed)
-----   Message from Pieter Partridge, DO sent at 11/02/2019 12:00 PM EST ----- 24 hour ambulatory EEG was normal.  At this point, I have no further recommendations.  She should follow up with her PCP.

## 2019-12-01 ENCOUNTER — Telehealth: Payer: Self-pay

## 2019-12-01 NOTE — Telephone Encounter (Signed)
Copied from [place] [id number]. Topic: General - Other >> Dec 01, 2019  4:03 PM Rainey Pines A wrote: Patient wants to know how long she needs to get her prolia injections for. Please advise

## 2019-12-08 NOTE — Telephone Encounter (Signed)
Usually long term (years), as far as she is tolerating medication well. It could be discontinued when DEXA is greatly improved,if she does not want to take medication forever. Also if side effects present or cost goes up. Thanks, BJ

## 2019-12-08 NOTE — Telephone Encounter (Signed)
Will discuss with pt at her cpe tomorrow.

## 2019-12-09 ENCOUNTER — Telehealth: Payer: Self-pay | Admitting: *Deleted

## 2019-12-09 ENCOUNTER — Ambulatory Visit (INDEPENDENT_AMBULATORY_CARE_PROVIDER_SITE_OTHER): Payer: Medicare Other | Admitting: Family Medicine

## 2019-12-09 ENCOUNTER — Encounter: Payer: Self-pay | Admitting: Family Medicine

## 2019-12-09 ENCOUNTER — Other Ambulatory Visit: Payer: Self-pay

## 2019-12-09 VITALS — BP 128/78 | HR 78 | Temp 95.5°F | Resp 16 | Ht 59.0 in | Wt 116.0 lb

## 2019-12-09 DIAGNOSIS — E782 Mixed hyperlipidemia: Secondary | ICD-10-CM | POA: Diagnosis not present

## 2019-12-09 DIAGNOSIS — Z5181 Encounter for therapeutic drug level monitoring: Secondary | ICD-10-CM

## 2019-12-09 DIAGNOSIS — E039 Hypothyroidism, unspecified: Secondary | ICD-10-CM

## 2019-12-09 DIAGNOSIS — Z0000 Encounter for general adult medical examination without abnormal findings: Secondary | ICD-10-CM

## 2019-12-09 DIAGNOSIS — Z780 Asymptomatic menopausal state: Secondary | ICD-10-CM

## 2019-12-09 DIAGNOSIS — M85852 Other specified disorders of bone density and structure, left thigh: Secondary | ICD-10-CM

## 2019-12-09 LAB — BASIC METABOLIC PANEL
BUN: 17 mg/dL (ref 6–23)
CO2: 31 mEq/L (ref 19–32)
Calcium: 10.9 mg/dL — ABNORMAL HIGH (ref 8.4–10.5)
Chloride: 101 mEq/L (ref 96–112)
Creatinine, Ser: 0.8 mg/dL (ref 0.40–1.20)
GFR: 69.69 mL/min (ref >60.00)
Glucose, Bld: 96 mg/dL (ref 70–99)
Potassium: 4.5 mEq/L (ref 3.5–5.1)
Sodium: 140 mEq/L (ref 135–145)

## 2019-12-09 LAB — LIPID PANEL
Cholesterol: 272 mg/dL — ABNORMAL HIGH (ref 0–200)
HDL: 56.3 mg/dL (ref >39.00)
NonHDL: 215.7
Total CHOL/HDL Ratio: 5
Triglycerides: 294 mg/dL — ABNORMAL HIGH (ref 0.0–149.0)
VLDL: 58.8 mg/dL — ABNORMAL HIGH (ref 0.0–40.0)

## 2019-12-09 LAB — LDL CHOLESTEROL, DIRECT: Direct LDL: 166 mg/dL

## 2019-12-09 LAB — HEPATIC FUNCTION PANEL
ALT: 17 U/L (ref 0–35)
AST: 20 U/L (ref 0–37)
Albumin: 4.4 g/dL (ref 3.5–5.2)
Alkaline Phosphatase: 86 U/L (ref 39–117)
Bilirubin, Direct: 0.1 mg/dL (ref 0.0–0.3)
Total Bilirubin: 0.5 mg/dL (ref 0.2–1.2)
Total Protein: 7.1 g/dL (ref 6.0–8.3)

## 2019-12-09 LAB — VITAMIN D 25 HYDROXY (VIT D DEFICIENCY, FRACTURES): VITD: 58.13 ng/mL (ref 30.00–100.00)

## 2019-12-09 LAB — TSH: TSH: 1.83 u[IU]/mL (ref 0.35–4.50)

## 2019-12-09 NOTE — Patient Instructions (Signed)
A few things to remember from today's visit:   Routine general medical examination at a health care facility  Acquired hypothyroidism  Osteoporosis, unspecified osteoporosis type, unspecified pathological fracture presence  Mixed hyperlipidemia - Plan: Basic Metabolic Panel, Lipid panel, Hepatic function panel  Osteopenia of left hip  Encounter for medication monitoring - Plan: VITAMIN D 25 Hydroxy (Vit-D Deficiency, Fractures)   Please be sure medication list is accurate. If a new problem present, please set up appointment sooner than planned today.   A few tips:  -As we age balance is not as good as it was, so there is a higher risks for falls. Please remove small rugs and furniture that is "in your way" and could increase the risk of falls. Stretching exercises may help with fall prevention: Yoga and Tai Chi are some examples. Low impact exercise is better, so you are not very achy the next day.  -Sun screen and avoidance of direct sun light recommended. Caution with dehydration, if working outdoors be sure to drink enough fluids.  - Some medications are not safe as we age, increases the risk of side effects and can potentially interact with other medication you are also taken;  including some of over the counter medications. Be sure to let me know when you start a new medication even if it is a dietary/vitamin supplement.   -Healthy diet low in red meet/animal fat and sugar + regular physical activity is recommended.

## 2019-12-09 NOTE — Progress Notes (Signed)
HPI:   Ms.Tristy Wahneta Bava is a 78 y.o. female, who is here today for her routine physical.  Last CPE: 01/2017.  Regular exercise 3 or more time per week: She rides a stationary bike once per week.  Following a healthy diet: Yes. She lives with her husband. [place] ADL's and IADL's. No falls in the past year.  Chronic medical problems: Osteopenia, hypothyroidism,HLD,and OA among some.   Immunization History  Administered Date(s) Administered  . Influenza, High Dose Seasonal PF 09/14/2019  . Pneumococcal Conjugate-13 11/08/2014  . Pneumococcal Polysaccharide-23 11/03/2014  . Pneumococcal-Unspecified 09/06/2008  . Zoster 09/06/2008   Mammogram: 02/07/2018 Bi-rads 1 Colonoscopy: 08/2016. According to pt,she is due this year. DEXA: 12/2016.  She has no new concerns today.  HLD: She is on non pharmacologic treatment. Statin medications caused muscle aching.  Component Name Value Ref Range  Cholesterol, Total 189 100 - 199 mg/dL  Triglycerides 189 (H) 0 - 149 mg/dL  HDL 68 >39 mg/dL  VLDL Cholesterol Cal 38 5 - 40 mg/dL  LDL Calculated 83 0 - 99 mg/dL  Specimen Collected on  Blood     Hypothyroidism:  TSH on 05/20/19 was mildly abnormal at 7.34. She is on Levothyroxine 50 mcg daily. Negative for abnormal wt changes,cold/heat intolerance,or tremor.  Osteopenia with high FRAX score. She has been on Prolia and would like to continue.She wonders for how long she needs to take it. She has been on biophosphonate before.  Takes Ca++ and vit D supplementation.  Review of Systems  Constitutional: Negative for appetite change, fatigue and fever.  HENT: Negative for hearing loss, mouth sores and sore throat.   Eyes: Negative for redness and visual disturbance.  Respiratory: Negative for cough, shortness of breath and wheezing.   Cardiovascular: Negative for chest pain and leg swelling.  Gastrointestinal: Negative for abdominal pain, nausea and vomiting.       No  changes in bowel habits.  Endocrine: Negative for cold intolerance, heat intolerance, polydipsia, polyphagia and polyuria.  Genitourinary: Negative for decreased urine volume, dysuria, hematuria, vaginal bleeding and vaginal discharge.  Musculoskeletal: Positive for back pain (A day after Prolia injection x 1 day). Negative for myalgias.  Skin: Negative for color change and rash.  Allergic/Immunologic: Positive for environmental allergies.  Neurological: Negative for syncope, weakness and headaches.  Psychiatric/Behavioral: Negative for confusion and sleep disturbance. The patient is not nervous/anxious.   All other systems reviewed and are negative.   Current Outpatient Medications on File Prior to Visit  Medication Sig Dispense Refill  . Ascorbic Acid (VITAMIN C) 1000 MG tablet Take by mouth.    . Cholecalciferol (VITAMIN D3) 400 units CAPS Take by mouth.    . denosumab (PROLIA) 60 MG/ML SOLN injection Inject into the skin.    . folic acid (FOLVITE) A999333 MCG tablet Take by mouth.    . Garlic 123XX123 MG CAPS Take by mouth.    . Glucosamine-Chondroitin 250-200 MG CAPS Take by mouth.    . levothyroxine (SYNTHROID, LEVOTHROID) 50 MCG tablet TAKE 1 TABLET(50 MCG) BY MOUTH DAILY 90 tablet 3  . Multiple Vitamins-Minerals (CENTRUM SILVER) tablet Take by mouth.    . Omega-3 Fatty Acids (FISH OIL) 1000 MG CAPS Take 1,000 mg by mouth daily.    . vitamin E 400 UNIT capsule Take by mouth.     Current Facility-Administered Medications on File Prior to Visit  Medication Dose Route Frequency Provider Last Rate Last Admin  . 0.9 %  sodium chloride  infusion  500 mL Intravenous Continuous Danis, Henry L III, MD      . 0.9 %  sodium chloride infusion  500 mL Intravenous Once Doran Stabler, MD         Past Medical History:  Diagnosis Date  . Allergy   . Arthritis    hands  . Blood transfusion without reported diagnosis 1963   with child birth  . Cancer [facility])    uterine cancer - age 5   .  Cataract    small, watching   . GERD (gastroesophageal reflux disease)   . Hyperlipidemia   . Osteopenia    gets prolia every 6 months   . Thyroid disease     Past Surgical History:  Procedure Laterality Date  . ABDOMINAL HYSTERECTOMY  1969  . COLONOSCOPY  08/05/2006   tic, moderate   . PARTIAL HYSTERECTOMY     has ovaries    No Known Allergies  Family History  Problem Relation Age of Onset  . Colon polyps Son   . Colon cancer Neg Hx   . Rectal cancer Neg Hx   . Stomach cancer Neg Hx   . Pancreatic cancer Neg Hx     Social History   Socioeconomic History  . Marital status: Married    Spouse name: Not on file  . Number of children: 2  . Years of education: Not on file  . Highest education level: High school graduate  Occupational History  . Not on file  Tobacco Use  . Smoking status: Never Smoker  . Smokeless tobacco: Never Used  Substance and Sexual Activity  . Alcohol use: No  . Drug use: No  . Sexual activity: Never  Other Topics Concern  . Not on file  Social History Narrative   Social Review      Patient lives at home with.SPOUSE      Pt lives in a one or two story home? 2 STORY HOME      Does patient lives in a facility? If so where? PRIVATE HOME      What is patient highest level of education? HIGH SCHOOL GRADUATE      Is patient RIGHT or LEFT handed? RIGHT HANDED      Does patient drink coffee, tea, soda? YES      How much coffee, tea, soda does patient drink? SODA SOMETIMES, COFFEE ONCE A WEEK      Does patient exercise regularly? YES SHE HAS EXERCISE BIKE IN HER HOME   Social Determinants of Health   Financial Resource Strain:   . Difficulty of Paying Living Expenses: Not on file  Food Insecurity:   . Worried About Charity fundraiser in the Last Year: Not on file  . Ran Out of Food in the Last Year: Not on file  Transportation Needs:   . Lack of Transportation (Medical): Not on file  . Lack of Transportation (Non-Medical): Not on  file  Physical Activity:   . Days of Exercise per Week: Not on file  . Minutes of Exercise per Session: Not on file  Stress:   . Feeling of Stress : Not on file  Social Connections:   . Frequency of Communication with Friends and Family: Not on file  . Frequency of Social Gatherings with Friends and Family: Not on file  . Attends Religious Services: Not on file  . Active Member of Clubs or Organizations: Not on file  . Attends Archivist Meetings: Not on  file  . Marital Status: Not on file    Vitals:   12/09/19 1049  BP: 128/78  Pulse: 78  Resp: 16  Temp: (!) 95.5 F (35.3 C)  SpO2: 98%   Body mass index is 23.43 kg/m.  Wt Readings from Last 3 Encounters:  12/09/19 116 lb (52.6 kg)  10/14/19 113 lb 12.8 oz (51.6 kg)  02/17/18 119 lb 2 oz (54 kg)    Physical Exam  Nursing note and vitals reviewed. Constitutional: She is oriented to person, place, and time. She appears well-developed and well-nourished. No distress.  HENT:  Head: Normocephalic and atraumatic.  Right Ear: Hearing, tympanic membrane, external ear and ear canal normal.  Left Ear: Hearing, tympanic membrane, external ear and ear canal normal.  Mouth/Throat: Uvula is midline, oropharynx is clear and moist and mucous membranes are normal.  Eyes: Pupils are equal, round, and reactive to light. Conjunctivae and EOM are normal.  Neck: No tracheal deviation present. No thyromegaly present.  Cardiovascular: Normal rate and regular rhythm.  No murmur heard. Pulses:      Dorsalis pedis pulses are 2+ on the right side and 2+ on the left side.  Respiratory: Effort normal and breath sounds normal. No respiratory distress.  GI: Soft. She exhibits no mass. There is no hepatomegaly. There is no abdominal tenderness.  Musculoskeletal:        General: No edema.     Comments: No major deformity or signs of synovitis appreciated.  Lymphadenopathy:    She has no cervical adenopathy.       Right: No  supraclavicular adenopathy present.       Left: No supraclavicular adenopathy present.  Neurological: She is alert and oriented to person, place, and time. She has normal strength. No cranial nerve deficit. Coordination and gait normal.  Reflex Scores:      Bicep reflexes are 2+ on the right side and 2+ on the left side.      Patellar reflexes are 2+ on the right side and 2+ on the left side. Skin: Skin is warm. No rash noted. No erythema.  Psychiatric: She has a normal mood and affect. Cognition and memory are normal.  Well groomed, good eye contact.    ASSESSMENT AND PLAN:  Ms. Polet Allgood was here today annual physical examination.  Orders Placed This Encounter  Procedures  . DG Bone Density  . Basic Metabolic Panel  . Lipid panel  . VITAMIN D 25 Hydroxy (Vit-D Deficiency, Fractures)  . Hepatic function panel  . TSH  . LDL cholesterol, direct    Lab Results  Component Value Date   CHOL 272 (H) 12/09/2019   HDL 56.30 12/09/2019   LDLDIRECT 166.0 12/09/2019   TRIG 294.0 (H) 12/09/2019   CHOLHDL 5 12/09/2019   Lab Results  Component Value Date   TSH 1.83 12/09/2019   Lab Results  Component Value Date   CREATININE 0.80 12/09/2019   BUN 17 12/09/2019   NA 140 12/09/2019   K 4.5 12/09/2019   CL 101 12/09/2019   CO2 31 12/09/2019    Routine general medical examination at a health care facility We discussed the importance of regular physical activity and healthy diet for prevention of chronic illness and/or complications. Preventive guidelines reviewed. Vaccination up to date.  Ca++ and vit D supplementation to continue. Next CPE in a year.  Acquired hypothyroidism No changes in current management, will follow labs done today and will give further recommendations accordingly.  Mixed hyperlipidemia -  Basic Metabolic Panel -     Lipid panel -     Hepatic function panel  Osteopenia of left hip Fall precautions. We will wait for DEXA result and will  re-evaluate treatment.  Encounter for medication monitoring -     VITAMIN D 25 Hydroxy (Vit-D Deficiency, Fractures)  Asymptomatic postmenopausal estrogen deficiency -     DG Bone Density; Future  Other orders -     LDL cholesterol, direct   Return in 1 year (on 12/08/2020) for cpe. Needs Medicare visit.   Chip Canepa G. Martinique, MD  [facility]. [place] office.

## 2019-12-09 NOTE — Telephone Encounter (Signed)
Insurance verification started for Prolia.  Patient is aware.

## 2019-12-15 NOTE — Telephone Encounter (Signed)
Prolia will cost around $230.  Patient is aware.  Patient will wait until after her bone density test results before scheduling her Prolia injection.

## 2020-01-25 ENCOUNTER — Other Ambulatory Visit: Payer: Self-pay | Admitting: Family Medicine

## 2020-02-19 ENCOUNTER — Encounter: Payer: Self-pay | Admitting: Gastroenterology

## 2020-02-29 ENCOUNTER — Other Ambulatory Visit: Payer: Self-pay

## 2020-02-29 ENCOUNTER — Ambulatory Visit (AMBULATORY_SURGERY_CENTER): Payer: Self-pay | Admitting: *Deleted

## 2020-02-29 VITALS — Temp 96.6°F | Ht 59.0 in | Wt 116.0 lb

## 2020-02-29 DIAGNOSIS — Z86010 Personal history of colonic polyps: Secondary | ICD-10-CM

## 2020-02-29 DIAGNOSIS — Z01818 Encounter for other preprocedural examination: Secondary | ICD-10-CM

## 2020-02-29 MED ORDER — SUPREP BOWEL PREP KIT 17.5-3.13-1.6 GM/177ML PO SOLN: 1.0000 | Freq: Once | ORAL | 0 refills | Status: AC

## 2020-02-29 NOTE — Progress Notes (Signed)
No egg or soy allergy known to patient  No issues with past sedation with any surgeries  or procedures, no intubation problems  No diet pills per patient No home 02 use per patient  No blood thinners per patient  Pt denies issues with constipation  No A fib or A flutter  EMMI video sent to pt's e mail  Husband in PV temp 96.8  Due to the COVID-19 pandemic we are asking patients to follow these guidelines. Please only bring one care partner. Please be aware that your care partner may wait in the car in the parking lot or if they feel like they will be too hot to wait in the car, they may wait in the lobby on the 4th floor. All care partners are required to wear a mask the entire time (we do not have any that we can provide them), they need to practice social distancing, and we will do a Covid check for all patient's and care partners when you arrive. Also we will check their temperature and your temperature. If the care partner waits in their car they need to stay in the parking lot the entire time and we will call them on their cell phone when the patient is ready for discharge so they can bring the car to the front of the building. Also all patient's will need to wear a mask into building.

## 2020-03-04 ENCOUNTER — Encounter: Payer: Self-pay | Admitting: Gastroenterology

## 2020-03-10 ENCOUNTER — Ambulatory Visit (AMBULATORY_SURGERY_CENTER): Payer: Medicare Other | Admitting: Gastroenterology

## 2020-03-10 ENCOUNTER — Other Ambulatory Visit: Payer: Self-pay

## 2020-03-10 ENCOUNTER — Encounter: Payer: Self-pay | Admitting: Gastroenterology

## 2020-03-10 VITALS — BP 137/68 | HR 84 | Temp 97.3°F | Resp 16 | Ht 59.0 in | Wt 116.0 lb

## 2020-03-10 DIAGNOSIS — K635 Polyp of colon: Secondary | ICD-10-CM

## 2020-03-10 DIAGNOSIS — Z86010 Personal history of colonic polyps: Secondary | ICD-10-CM

## 2020-03-10 DIAGNOSIS — D122 Benign neoplasm of ascending colon: Secondary | ICD-10-CM

## 2020-03-10 MED ORDER — SODIUM CHLORIDE 0.9 % IV SOLN: 500.0000 mL | Freq: Once | INTRAVENOUS | Status: DC

## 2020-03-10 NOTE — Progress Notes (Signed)
Called to room to assist during endoscopic procedure.  Patient ID and intended procedure confirmed with present staff. Received instructions for my participation in the procedure from the performing physician.  

## 2020-03-10 NOTE — Patient Instructions (Signed)
Handouts Provided:  Polyps  YOU HAD AN ENDOSCOPIC PROCEDURE TODAY AT THE [facility]:   Refer to the procedure report that was given to you for any specific questions about what was found during the examination.  If the procedure report does not answer your questions, please call your gastroenterologist to clarify.  If you requested that your care partner not be given the details of your procedure findings, then the procedure report has been included in a sealed envelope for you to review at your convenience later.  YOU SHOULD EXPECT: Some feelings of bloating in the abdomen. Passage of more gas than usual.  Walking can help get rid of the air that was put into your GI tract during the procedure and reduce the bloating. If you had a lower endoscopy (such as a colonoscopy or flexible sigmoidoscopy) you may notice spotting of blood in your stool or on the toilet paper. If you underwent a bowel prep for your procedure, you may not have a normal bowel movement for a few days.  Please Note:  You might notice some irritation and congestion in your nose or some drainage.  This is from the oxygen used during your procedure.  There is no need for concern and it should clear up in a day or so.  SYMPTOMS TO REPORT IMMEDIATELY:   Following lower endoscopy (colonoscopy or flexible sigmoidoscopy):  Excessive amounts of blood in the stool  Significant tenderness or worsening of abdominal pains  Swelling of the abdomen that is new, acute  Fever of 100F or higher  For urgent or emergent issues, a gastroenterologist can be reached at any hour by calling [phone]. Do not use MyChart messaging for urgent concerns.    DIET:  We do recommend a small meal at first, but then you may proceed to your regular diet.  Drink plenty of fluids but you should avoid alcoholic beverages for 24 hours.  ACTIVITY:  You should plan to take it easy for the rest of today and you should NOT DRIVE or use heavy  machinery until tomorrow (because of the sedation medicines used during the test).    FOLLOW UP: Our staff will call the number listed on your records 48-72 hours following your procedure to check on you and address any questions or concerns that you may have regarding the information given to you following your procedure. If we do not reach you, we will leave a message.  We will attempt to reach you two times.  During this call, we will ask if you have developed any symptoms of COVID 19. If you develop any symptoms (ie: fever, flu-like symptoms, shortness of breath, cough etc.) before then, please call [phone].  If you test positive for Covid 19 in the 2 weeks post procedure, please call and report this information to us.    If any biopsies were taken you will be contacted by phone or by letter within the next 1-3 weeks.  Please call us at [phone] if you have not heard about the biopsies in 3 weeks.    SIGNATURES/CONFIDENTIALITY: You and/or your care partner have signed paperwork which will be entered into your electronic medical record.  These signatures attest to the fact that that the information above on your After Visit Summary has been reviewed and is understood.  Full responsibility of the confidentiality of this discharge information lies with you and/or your care-partner.  

## 2020-03-10 NOTE — Progress Notes (Signed)
pt tolerated well. VSS. awake and to recovery. Report given to RN.  

## 2020-03-10 NOTE — Op Note (Addendum)
[facility] Patient Name: Allison Roberson Procedure Date: 03/10/2020 2:36 PM MRN: [id number] Endoscopist: Mallie Mussel L. Loletha Carrow , MD Age: 79 Referring MD:  Date of Birth: 27-May-1941 Gender: Female Account #: [id number] Procedure:                Colonoscopy Indications:              Surveillance: Personal history of adenomatous                            polyps on last colonoscopy > 3 years ago (4mm TA ;                            08/2016) Medicines:                Monitored Anesthesia Care Procedure:                Pre-Anesthesia Assessment:                           - Prior to the procedure, a History and Physical                            was performed, and patient medications and                            allergies were reviewed. The patient's tolerance of                            previous anesthesia was also reviewed. The risks                            and benefits of the procedure and the sedation                            options and risks were discussed with the patient.                            All questions were answered, and informed consent                            was obtained. Prior Anticoagulants: The patient has                            taken no previous anticoagulant or antiplatelet                            agents except aspirin. ASA Grade Assessment: II - A                            patient with mild systemic disease. After reviewing                            the risks and benefits, the patient was deemed in  satisfactory condition to undergo the procedure.                           After obtaining informed consent, the colonoscope                            was passed under direct vision. Throughout the                            procedure, the patient's blood pressure, pulse, and                            oxygen saturations were monitored continuously. The                            Colonoscope was introduced through the  anus and                            advanced to the the cecum, identified by                            appendiceal orifice and ileocecal valve. The                            colonoscopy was performed with difficulty due to                            multiple diverticula in the colon and a tortuous                            colon. Successful completion of the procedure was                            aided by changing the patient to a supine position                            and using manual pressure. The patient tolerated                            the procedure well. The quality of the bowel                            preparation was excellent. The ileocecal valve,                            appendiceal orifice, and rectum were photographed. Scope In: 2:49:37 PM Scope Out: 3:08:18 PM Scope Withdrawal Time: 0 hours 10 minutes 3 seconds  Total Procedure Duration: 0 hours 18 minutes 41 seconds  Findings:                 The perianal and digital rectal examinations were                            normal.  A diminutive polyp was found in the ascending                            colon. The polyp was sessile. The polyp was removed                            with a cold snare. Resection and retrieval were                            complete.                           Multiple small-mouthed diverticula were found in                            the distal sigmoid colon. There was associated                            tortuosity making scope passage challenging.                           Retroflexion in the rectum was not performed due to                            anatomy.                           Internal hemorrhoids were found.                           The exam was otherwise without abnormality. Complications:            No immediate complications. Estimated Blood Loss:     Estimated blood loss was minimal. Impression:               - One diminutive polyp in the  ascending colon,                            removed with a cold snare. Resected and retrieved.                           - Diverticulosis in the distal sigmoid colon.                           - Internal hemorrhoids.                           - The examination was otherwise normal. Recommendation:           - Patient has a contact number available for                            emergencies. The signs and symptoms of potential                            delayed complications were discussed with the  patient. Return to normal activities tomorrow.                            Written discharge instructions were provided to the                            patient.                           - Resume previous diet.                           - Continue present medications.                           - Await pathology results.                           - Based on current guilelines, no repeat routine                            surveillance colonoscopy recommended. Sherrill Mckamie L. Loletha Carrow, MD 03/10/2020 3:15:15 PM This report has been signed electronically.

## 2020-03-10 NOTE — Progress Notes (Signed)
Pt's states no medical or surgical changes since previsit or office visit.   Temp-jb  V/s-dt

## 2020-03-14 ENCOUNTER — Telehealth: Payer: Self-pay | Admitting: *Deleted

## 2020-03-14 NOTE — Telephone Encounter (Signed)
1. Have you developed a fever since your procedure? no  2.   Have you had an respiratory symptoms (SOB or cough) since your procedure? no  3.   Have you tested positive for COVID 19 since your procedure no  4.   Have you had any family members/close contacts diagnosed with the COVID 19 since your procedure?  no   If yes to any of these questions please route to Joylene John, RN and Erenest Rasher, RN Follow up Call-  Call back number 03/10/2020 11/22/2017  Post procedure Call Back phone  # [phone]  Permission to leave phone message No Yes  comments answering machine not plugged in per wife -  Some recent data might be hidden     Patient questions:  Do you have a fever, pain , or abdominal swelling? No. Pain Score  0 *  Have you tolerated food without any problems? Yes.    Have you been able to return to your normal activities? Yes.    Do you have any questions about your discharge instructions: Diet   No. Medications  No. Follow up visit  No.  Do you have questions or concerns about your Care? No.  Actions: * If pain score is 4 or above: No action needed, pain <4.

## 2020-03-20 ENCOUNTER — Encounter: Payer: Self-pay | Admitting: Gastroenterology

## 2020-06-19 ENCOUNTER — Inpatient Hospital Stay (HOSPITAL_COMMUNITY): Payer: Medicare Other

## 2020-06-19 ENCOUNTER — Other Ambulatory Visit: Payer: Self-pay

## 2020-06-19 ENCOUNTER — Emergency Department (HOSPITAL_COMMUNITY): Payer: Medicare Other

## 2020-06-19 ENCOUNTER — Observation Stay (HOSPITAL_COMMUNITY)
Admission: EM | Admit: 2020-06-19 | Discharge: 2020-06-20 | Disposition: A | Discharge status: Home / Self Care | Payer: Medicare Other | Attending: Internal Medicine | Admitting: Internal Medicine

## 2020-06-19 ENCOUNTER — Encounter (HOSPITAL_COMMUNITY): Payer: Self-pay | Admitting: Emergency Medicine

## 2020-06-19 DIAGNOSIS — E785 Hyperlipidemia, unspecified: Secondary | ICD-10-CM | POA: Diagnosis not present

## 2020-06-19 DIAGNOSIS — M19042 Primary osteoarthritis, left hand: Secondary | ICD-10-CM | POA: Diagnosis not present

## 2020-06-19 DIAGNOSIS — R4182 Altered mental status, unspecified: Principal | ICD-10-CM | POA: Diagnosis present

## 2020-06-19 DIAGNOSIS — G9341 Metabolic encephalopathy: Secondary | ICD-10-CM | POA: Diagnosis not present

## 2020-06-19 DIAGNOSIS — Z79899 Other long term (current) drug therapy: Secondary | ICD-10-CM | POA: Diagnosis not present

## 2020-06-19 DIAGNOSIS — R1310 Dysphagia, unspecified: Secondary | ICD-10-CM | POA: Insufficient documentation

## 2020-06-19 DIAGNOSIS — Z7982 Long term (current) use of aspirin: Secondary | ICD-10-CM | POA: Insufficient documentation

## 2020-06-19 DIAGNOSIS — R4789 Other speech disturbances: Secondary | ICD-10-CM | POA: Diagnosis not present

## 2020-06-19 DIAGNOSIS — Z79890 Hormone replacement therapy (postmenopausal): Secondary | ICD-10-CM | POA: Insufficient documentation

## 2020-06-19 DIAGNOSIS — Z20822 Contact with and (suspected) exposure to covid-19: Secondary | ICD-10-CM | POA: Insufficient documentation

## 2020-06-19 DIAGNOSIS — Z8542 Personal history of malignant neoplasm of other parts of uterus: Secondary | ICD-10-CM | POA: Insufficient documentation

## 2020-06-19 DIAGNOSIS — M19041 Primary osteoarthritis, right hand: Secondary | ICD-10-CM | POA: Diagnosis not present

## 2020-06-19 DIAGNOSIS — Z90711 Acquired absence of uterus with remaining cervical stump: Secondary | ICD-10-CM | POA: Insufficient documentation

## 2020-06-19 DIAGNOSIS — E039 Hypothyroidism, unspecified: Secondary | ICD-10-CM | POA: Diagnosis not present

## 2020-06-19 DIAGNOSIS — Z90710 Acquired absence of both cervix and uterus: Secondary | ICD-10-CM | POA: Diagnosis not present

## 2020-06-19 DIAGNOSIS — I6782 Cerebral ischemia: Secondary | ICD-10-CM | POA: Insufficient documentation

## 2020-06-19 DIAGNOSIS — M8580 Other specified disorders of bone density and structure, unspecified site: Secondary | ICD-10-CM | POA: Insufficient documentation

## 2020-06-19 DIAGNOSIS — Z8673 Personal history of transient ischemic attack (TIA), and cerebral infarction without residual deficits: Secondary | ICD-10-CM | POA: Insufficient documentation

## 2020-06-19 DIAGNOSIS — R531 Weakness: Secondary | ICD-10-CM | POA: Insufficient documentation

## 2020-06-19 DIAGNOSIS — R569 Unspecified convulsions: Secondary | ICD-10-CM

## 2020-06-19 DIAGNOSIS — R404 Transient alteration of awareness: Secondary | ICD-10-CM

## 2020-06-19 LAB — CBC
HCT: 45.4 % (ref 36.0–46.0)
Hemoglobin: 14.7 g/dL (ref 12.0–15.0)
MCH: 30.9 pg (ref 26.0–34.0)
MCHC: 32.4 g/dL (ref 30.0–36.0)
MCV: 95.6 fL (ref 80.0–100.0)
Platelets: 218 10*3/uL (ref 150–400)
RBC: 4.75 MIL/uL (ref 3.87–5.11)
RDW: 13.2 % (ref 11.5–15.5)
WBC: 7.7 10*3/uL (ref 4.0–10.5)
nRBC: 0 % (ref 0.0–0.2)

## 2020-06-19 LAB — I-STAT CHEM 8, ED
BUN: 16 mg/dL (ref 8–23)
Calcium, Ion: 1.19 mmol/L (ref 1.15–1.40)
Chloride: 103 mmol/L (ref 98–111)
Creatinine, Ser: 0.8 mg/dL (ref 0.44–1.00)
Glucose, Bld: 127 mg/dL — ABNORMAL HIGH (ref 70–99)
HCT: 45 % (ref 36.0–46.0)
Hemoglobin: 15.3 g/dL — ABNORMAL HIGH (ref 12.0–15.0)
Potassium: 4 mmol/L (ref 3.5–5.1)
Sodium: 141 mmol/L (ref 135–145)
TCO2: 27 mmol/L (ref 22–32)

## 2020-06-19 LAB — DIFFERENTIAL
Abs Immature Granulocytes: 0.02 10*3/uL (ref 0.00–0.07)
Basophils Absolute: 0.1 10*3/uL (ref 0.0–0.1)
Basophils Relative: 1 %
Eosinophils Absolute: 0.2 10*3/uL (ref 0.0–0.5)
Eosinophils Relative: 3 %
Immature Granulocytes: 0 %
Lymphocytes Relative: 55 %
Lymphs Abs: 4.2 10*3/uL — ABNORMAL HIGH (ref 0.7–4.0)
Monocytes Absolute: 0.9 10*3/uL (ref 0.1–1.0)
Monocytes Relative: 11 %
Neutro Abs: 2.3 10*3/uL (ref 1.7–7.7)
Neutrophils Relative %: 30 %

## 2020-06-19 LAB — COMPREHENSIVE METABOLIC PANEL
ALT: 18 U/L (ref 0–44)
AST: 33 U/L (ref 15–41)
Albumin: 3.6 g/dL (ref 3.5–5.0)
Alkaline Phosphatase: 112 U/L (ref 38–126)
Anion gap: 13 (ref 5–15)
BUN: 14 mg/dL (ref 8–23)
CO2: 21 mmol/L — ABNORMAL LOW (ref 22–32)
Calcium: 9.6 mg/dL (ref 8.9–10.3)
Chloride: 103 mmol/L (ref 98–111)
Creatinine, Ser: 0.85 mg/dL (ref 0.44–1.00)
GFR calc Af Amer: >60 mL/min (ref >60)
GFR calc non Af Amer: >60 mL/min (ref >60)
Glucose, Bld: 128 mg/dL — ABNORMAL HIGH (ref 70–99)
Potassium: 4.1 mmol/L (ref 3.5–5.1)
Sodium: 137 mmol/L (ref 135–145)
Total Bilirubin: 0.8 mg/dL (ref 0.3–1.2)
Total Protein: 6.6 g/dL (ref 6.5–8.1)

## 2020-06-19 LAB — URINALYSIS, ROUTINE W REFLEX MICROSCOPIC
Bilirubin Urine: NEGATIVE
Glucose, UA: NEGATIVE mg/dL
Hgb urine dipstick: NEGATIVE
Ketones, ur: NEGATIVE mg/dL
Leukocytes,Ua: NEGATIVE
Nitrite: NEGATIVE
Protein, ur: NEGATIVE mg/dL
Specific Gravity, Urine: 1.009 (ref 1.005–1.030)
pH: 7 (ref 5.0–8.0)

## 2020-06-19 LAB — ETHANOL: Alcohol, Ethyl (B): <10 mg/dL (ref <10)

## 2020-06-19 LAB — RAPID URINE DRUG SCREEN, HOSP PERFORMED
Amphetamines: NOT DETECTED
Barbiturates: NOT DETECTED
Benzodiazepines: NOT DETECTED
Cocaine: NOT DETECTED
Opiates: NOT DETECTED
Tetrahydrocannabinol: NOT DETECTED

## 2020-06-19 LAB — SARS CORONAVIRUS 2 BY RT PCR (HOSPITAL ORDER, PERFORMED IN ~~LOC~~ HOSPITAL LAB): SARS Coronavirus 2: NEGATIVE

## 2020-06-19 LAB — APTT: aPTT: 25 seconds (ref 24–36)

## 2020-06-19 LAB — PROTIME-INR
INR: 0.9 (ref 0.8–1.2)
Prothrombin Time: 11.8 seconds (ref 11.4–15.2)

## 2020-06-19 LAB — CBG MONITORING, ED: Glucose-Capillary: 124 mg/dL — ABNORMAL HIGH (ref 70–99)

## 2020-06-19 MED ORDER — LEVOTHYROXINE SODIUM 50 MCG PO TABS
50.0000 ug | ORAL_TABLET | Freq: Every day | ORAL | Status: DC
  Administered 2020-06-19: 50 ug via ORAL
  Filled 2020-06-19: qty 1

## 2020-06-19 MED ORDER — LEVOTHYROXINE SODIUM 50 MCG PO TABS
50.0000 ug | ORAL_TABLET | Freq: Every day | ORAL | Status: DC
  Administered 2020-06-20: 50 ug via ORAL
  Filled 2020-06-19: qty 1

## 2020-06-19 MED ORDER — ONDANSETRON HCL 4 MG/2ML IJ SOLN
4.0000 mg | Freq: Once | INTRAMUSCULAR | Status: AC
  Administered 2020-06-19: 4 mg via INTRAVENOUS
  Filled 2020-06-19: qty 2

## 2020-06-19 MED ORDER — ONDANSETRON HCL 4 MG PO TABS: 4.0000 mg | ORAL_TABLET | Freq: Four times a day (QID) | ORAL | Status: DC | PRN

## 2020-06-19 MED ORDER — VITAMIN B-12 1000 MCG PO TABS
1000.0000 ug | ORAL_TABLET | Freq: Every day | ORAL | Status: DC
  Administered 2020-06-19 – 2020-06-20 (×2): 1000 ug via ORAL
  Filled 2020-06-19 (×2): qty 1

## 2020-06-19 MED ORDER — ACETAMINOPHEN 325 MG PO TABS
650.0000 mg | ORAL_TABLET | Freq: Four times a day (QID) | ORAL | Status: DC | PRN
  Administered 2020-06-19: 650 mg via ORAL
  Filled 2020-06-19: qty 2

## 2020-06-19 MED ORDER — ENOXAPARIN SODIUM 40 MG/0.4ML ~~LOC~~ SOLN
40.0000 mg | SUBCUTANEOUS | Status: DC
  Administered 2020-06-19 – 2020-06-20 (×2): 40 mg via SUBCUTANEOUS
  Filled 2020-06-19 (×2): qty 0.4

## 2020-06-19 MED ORDER — SODIUM CHLORIDE 0.9 % IV SOLN
2000.0000 mg | INTRAVENOUS | Status: AC
  Administered 2020-06-19: 2000 mg via INTRAVENOUS
  Filled 2020-06-19: qty 20

## 2020-06-19 MED ORDER — ASPIRIN 81 MG PO CHEW
81.0000 mg | CHEWABLE_TABLET | Freq: Every day | ORAL | Status: DC
  Administered 2020-06-19 – 2020-06-20 (×2): 81 mg via ORAL
  Filled 2020-06-19 (×2): qty 1

## 2020-06-19 MED ORDER — GADOBUTROL 1 MMOL/ML IV SOLN
5.0000 mL | Freq: Once | INTRAVENOUS | Status: AC | PRN
  Administered 2020-06-19: 5 mL via INTRAVENOUS

## 2020-06-19 MED ORDER — OMEGA-3-ACID ETHYL ESTERS 1 G PO CAPS
1.0000 g | ORAL_CAPSULE | Freq: Every day | ORAL | Status: DC
  Administered 2020-06-19 – 2020-06-20 (×2): 1 g via ORAL
  Filled 2020-06-19 (×3): qty 1

## 2020-06-19 MED ORDER — LEVETIRACETAM 500 MG PO TABS
500.0000 mg | ORAL_TABLET | Freq: Two times a day (BID) | ORAL | Status: DC
  Administered 2020-06-19 – 2020-06-20 (×2): 500 mg via ORAL
  Filled 2020-06-19 (×2): qty 1

## 2020-06-19 MED ORDER — VITAMIN E 180 MG (400 UNIT) PO CAPS
400.0000 [IU] | ORAL_CAPSULE | Freq: Every day | ORAL | Status: DC
  Administered 2020-06-19 – 2020-06-20 (×2): 400 [IU] via ORAL
  Filled 2020-06-19 (×2): qty 1

## 2020-06-19 MED ORDER — ACETAMINOPHEN 650 MG RE SUPP: 650.0000 mg | Freq: Four times a day (QID) | RECTAL | Status: DC | PRN

## 2020-06-19 MED ORDER — FOLIC ACID 1 MG PO TABS
500.0000 ug | ORAL_TABLET | Freq: Every day | ORAL | Status: DC
  Administered 2020-06-19 – 2020-06-20 (×2): 0.5 mg via ORAL
  Filled 2020-06-19 (×2): qty 1

## 2020-06-19 MED ORDER — ONDANSETRON HCL 4 MG/2ML IJ SOLN: 4.0000 mg | Freq: Four times a day (QID) | INTRAMUSCULAR | Status: DC | PRN

## 2020-06-19 NOTE — Procedures (Signed)
Date of Study: 06/19/20  Reason for Study: Pt is a 60  YOF with PMH of TIA x 3, remote history of uterine cancer, HLD, and hypothyroidism who presents with AMS. Eval for seizures.  Description of recording:  This recording was obtained using a Digital EEG system with 18 channel capacity . Standard bipolar and referential EEG montages were used following the International  10 - 20  System .   This is a digitally recorded EEG with duration of approximately ~23:20 minutes. The background consists of 9-10 hz alpha rhythm which attenuates with eye opening and closure.  There were no epileptiform discharges or focal slowing noted during study.  There were no recorded electrographic seizures. Hyperventilation was not performed. Photic stimulation was not performed. Sleep did not occur in this recording.     IMPRESSION: This is a normal EEG for age.   CLINICAL CORRELATION: This EEG finding did not support the diagnosis of seizure.  Clinical correlation is advised. A normal EEG does not rule out epilepsy, which is a clinical diagnosis.  I have personally reviewed this study.

## 2020-06-19 NOTE — ED Notes (Signed)
Seizures pads in place.

## 2020-06-19 NOTE — Progress Notes (Signed)
Patient arrived to unit A/O x3. Patient in bed, left in lowest position, call light within reach. Will continue to monitor.  Gwendolyn Grant, RN

## 2020-06-19 NOTE — ED Provider Notes (Signed)
[facility] EMERGENCY DEPARTMENT Provider Note   CSN: [id number] Arrival date & time: 06/19/20  3428  An emergency department physician performed an initial assessment on this suspected stroke patient at 0707.  History No chief complaint on file.   Allison Roberson is a 79 y.o. female.  79 yo F with a chief complaints of altered mental status.  The patient apparently had stopped breathing in the middle the night and CPR was performed by her husband.  She arrived to the ED here as a code stroke.  Was initially nonverbal with EMS but is speaking upon arrival to the ED.  Complaining of some nausea and feeling like she needs to urinate.  Denies chest pain denies shortness of breath denies headache.  Level 5 caveat acuity of condition.  The history is provided by the patient.       Past Medical History:  Diagnosis Date  . Allergy   . Arthritis    hands  . Blood transfusion without reported diagnosis 1963   with child birth  . Cancer [facility])    uterine cancer - age 72   . Cataract    small, watching   . GERD (gastroesophageal reflux disease)   . Heart murmur    with pregnancy- none noted after pregnancies   . Hyperlipidemia   . Osteopenia    gets prolia every 6 months   . 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 Thyroid disease     Patient Active Problem List   Diagnosis Date Noted  . Seizure ([place]) 06/19/2020  . AMS (altered mental status) 06/19/2020  . Dysphagia 02/17/2018  . History of endometrial cancer 11/06/2017  . Prehypertension 02/14/2017  . TMJ (temporomandibular joint syndrome) 01/11/2016  . Menopausal hot flushes 11/08/2014  . Osteoarthritis, multiple sites 09/30/2013  . Stress incontinence 09/30/2013  . Osteopenia 10/16/2012  . Diverticulosis 09/05/2012  . Allergic rhinitis 08/24/2011  . Acquired hypothyroidism 03/16/2011  . Mixed  hyperlipidemia 03/16/2011  . Lichen sclerosus et atrophicus 09/15/2010    Past Surgical History:  Procedure Laterality Date  . ABDOMINAL HYSTERECTOMY  1969  . COLONOSCOPY  08/05/2006   tic, moderate   . PARTIAL HYSTERECTOMY     has ovaries  . POLYPECTOMY       OB History   No obstetric history on file.     Family History  Problem Relation Age of Onset  . Colon polyps Son   . Colon cancer Neg Hx   . Rectal cancer Neg Hx   . Stomach cancer Neg Hx   . Pancreatic cancer Neg Hx   . Esophageal cancer Neg Hx     Social History   Tobacco Use  . Smoking status: Never Smoker  . Smokeless tobacco: Never Used  Vaping Use  . Vaping Use: Never used  Substance Use Topics  . Alcohol use: No  . Drug use: No    Home Medications Prior to Admission medications   Medication Sig Start Date End Date Taking? Authorizing Provider  Ascorbic Acid (VITAMIN C) 1000 MG tablet Take by mouth.    [provider]  aspirin 81 MG chewable tablet Chew by mouth daily.    [provider]  Cholecalciferol (VITAMIN D3) 400 units CAPS Take by mouth.    [provider]  Cyanocobalamin (VITAMIN B 12 PO) Take 1,000  mg by mouth daily.    [provider]  denosumab (PROLIA) 60 MG/ML SOLN injection Inject into the skin.    [provider]  folic acid (FOLVITE) 500 MCG tablet Take by mouth.    [provider]  Garlic 3704 MG CAPS Take by mouth.    [provider]  Glucosamine-Chondroitin 250-200 MG CAPS Take by mouth.    [provider]  levothyroxine (SYNTHROID) 50 MCG tablet TAKE 1 TABLET(50 MCG) BY MOUTH DAILY Patient taking differently: Take 50 mcg by mouth daily.  01/25/20   Martinique, Betty G, MD  Multiple Vitamins-Minerals (CENTRUM SILVER) tablet Take by mouth.    [provider]  Omega-3 Fatty Acids (FISH OIL) 1000 MG CAPS Take 1,000 mg by mouth daily.    [provider]  vitamin E 400 UNIT capsule Take by mouth.     [provider]    Allergies    Patient has no known allergies.  Review of Systems   Review of Systems  Constitutional: Negative for chills and fever.  HENT: Negative for congestion and rhinorrhea.   Eyes: Negative for redness and visual disturbance.  Respiratory: Negative for shortness of breath and wheezing.   Cardiovascular: Negative for chest pain and palpitations.  Gastrointestinal: Positive for nausea. Negative for abdominal pain and vomiting.  Genitourinary: Negative for dysuria and urgency.  Musculoskeletal: Negative for arthralgias and myalgias.  Skin: Negative for pallor and wound.  Neurological: Negative for dizziness and headaches.    Physical Exam Updated Vital Signs BP 130/66   Pulse 82   Temp 98 F (36.7 C) (Oral)   Resp 20   Ht 4\' 11"  (1.499 m)   Wt 52.6 kg   SpO2 98%   BMI 23.42 kg/m   Physical Exam Vitals and nursing note reviewed.  Constitutional:      General: She is not in acute distress.    Appearance: She is well-developed. She is not diaphoretic.  HENT:     Head: Normocephalic and atraumatic.     Comments: Bite marks to the left lateral aspect of the tongue. Bite mark to the lower lip. Eyes:     Pupils: Pupils are equal, round, and reactive to light.  Cardiovascular:     Rate and Rhythm: Normal rate and regular rhythm.     Heart sounds: No murmur heard.  No friction rub. No gallop.   Pulmonary:     Effort: Pulmonary effort is normal.     Breath sounds: No wheezing or rales.  Abdominal:     General: There is no distension.     Palpations: Abdomen is soft.     Tenderness: There is no abdominal tenderness.  Musculoskeletal:        General: No tenderness.     Cervical back: Normal range of motion and neck supple.  Skin:    General: Skin is warm and dry.  Neurological:     Mental Status: She is alert and oriented to person, place, and time.  Psychiatric:        Behavior: Behavior normal.     ED Results / Procedures /  Treatments   Labs (all labs ordered are listed, but only abnormal results are displayed) Labs Reviewed  DIFFERENTIAL - Abnormal; Notable for the following components:      Result Value   Lymphs Abs 4.2 (*)    All other components within normal limits  COMPREHENSIVE METABOLIC PANEL - Abnormal; Notable for the following components:   CO2 21 (*)  Glucose, Bld 128 (*)    All other components within normal limits  URINALYSIS, ROUTINE W REFLEX MICROSCOPIC - Abnormal; Notable for the following components:   Color, Urine STRAW (*)    All other components within normal limits  I-STAT CHEM 8, ED - Abnormal; Notable for the following components:   Glucose, Bld 127 (*)    Hemoglobin 15.3 (*)    All other components within normal limits  CBG MONITORING, ED - Abnormal; Notable for the following components:   Glucose-Capillary 124 (*)    All other components within normal limits  SARS CORONAVIRUS 2 BY RT PCR (HOSPITAL ORDER, [facility])  ETHANOL  PROTIME-INR  APTT  CBC  RAPID URINE DRUG SCREEN, HOSP PERFORMED    EKG EKG Interpretation  Date/Time:  Sunday June 19 2020 07:37:43 EDT Ventricular Rate:  88 PR Interval:    QRS Duration: 95 QT Interval:  369 QTC Calculation: 447 R Axis:   62 Text Interpretation: Sinus rhythm No old tracing to compare Confirmed by Deno Etienne [phone]) on 06/19/2020 8:23:19 AM   Radiology DG Chest Port 1 View  Result Date: 06/19/2020 CLINICAL DATA:  Altered mental status EXAM: PORTABLE CHEST 1 VIEW COMPARISON:  None. FINDINGS: The heart size and mediastinal contours are within normal limits. Increased pulmonary interstitium is identified bilaterally. There are question small nodules left peri and suprahilar region. There is no focal pneumonia or pleural effusion. The visualized skeletal structures are stable. IMPRESSION: Increased pulmonary interstitium bilaterally. This can be seen in interstitial edema or viral infection. There are  question small nodules left peri and suprahilar region. Recommend further evaluation with chest CT on outpatient basis. Electronically Signed   By: Abelardo Diesel M.D.   On: 06/19/2020 08:06   CT HEAD CODE STROKE WO CONTRAST  Result Date: 06/19/2020 CLINICAL DATA:  Code stroke. 79 year old female with left arm weakness and aphasia. EXAM: CT HEAD WITHOUT CONTRAST TECHNIQUE: Contiguous axial images were obtained from the base of the skull through the vertex without intravenous contrast. COMPARISON:  None. FINDINGS: Brain: Perisylvian cerebral volume loss. Chronic appearing small to moderate sized infarct in the right cerebellum on series 3, image 11. Patchy asymmetric mostly superior frontal and parietal lobe white matter hypodensity. No acute intracranial hemorrhage identified. No midline shift, mass effect, or evidence of intracranial mass lesion. No ventriculomegaly. No cortically based acute infarct identified. No cerebral cortex encephalomalacia identified. Vascular: Calcified atherosclerosis at the skull base. No suspicious intracranial vascular hyperdensity. Skull: No acute osseous abnormality identified. Sinuses/Orbits: Visible paranasal sinuses are well pneumatized. Tympanic cavities and mastoids are clear. Other: Visualized orbits and scalp soft tissues are within normal limits. ASPECTS [facility] Stroke Program Early CT Score) Total score (0-10 with 10 being normal): 10 IMPRESSION: 1. No acute cortically based infarct or acute intracranial hemorrhage identified. ASPECTS 10. 2. Chronic appearing right cerebellar infarct. Mild to moderate for age white matter changes. 3. These results were communicated to Dr. Cheral Marker at 7:27 am on 06/19/2020 by text page via the [facility] messaging system. Electronically Signed   By: Genevie Ann M.D.   On: 06/19/2020 07:28    Procedures Procedures (including critical care time)  Medications Ordered in ED Medications  aspirin chewable tablet 81 mg (has no administration in time  range)  levothyroxine (SYNTHROID) tablet 50 mcg (has no administration in time range)  Vitamin B 12 LOZG 1,000 mg (has no administration in time range)  folic acid (FOLVITE) tablet 400 mcg (has no administration in time  range)  omega-3 acid ethyl esters (LOVAZA) capsule 1 g (has no administration in time range)  vitamin E capsule 400 Units (has no administration in time range)  enoxaparin (LOVENOX) injection 40 mg (has no administration in time range)  acetaminophen (TYLENOL) tablet 650 mg (has no administration in time range)    Or  acetaminophen (TYLENOL) suppository 650 mg (has no administration in time range)  ondansetron (ZOFRAN) tablet 4 mg (has no administration in time range)    Or  ondansetron (ZOFRAN) injection 4 mg (has no administration in time range)  ondansetron (ZOFRAN) injection 4 mg (4 mg Intravenous Given 06/19/20 0834)    ED Course  I have reviewed the triage vital signs and the nursing notes.  Pertinent labs & imaging results that were available during my care of the patient were reviewed by me and considered in my medical decision making (see chart for details).    MDM Rules/Calculators/A&P                          79 yo F with a cc of AMS. Had a presentation similarly and was seen at OSH.  Seeing neuro as an outpatient with an EEG with an abnormal signal.  CT scan of the head here without intracranial bleeding. Seen by neurology on arrival thought less likely to be stroke but somewhat concerning for a seizure. We will do an EEG now. Will discuss with hospitalist for observation.  The patients results and plan were reviewed and discussed.   Any x-rays performed were independently reviewed by myself.   Differential diagnosis were considered with the presenting HPI.  Medications  aspirin chewable tablet 81 mg (has no administration in time range)  levothyroxine (SYNTHROID) tablet 50 mcg (has no administration in time range)  Vitamin B 12 LOZG 1,000 mg (has no  administration in time range)  folic acid (FOLVITE) tablet 400 mcg (has no administration in time range)  omega-3 acid ethyl esters (LOVAZA) capsule 1 g (has no administration in time range)  vitamin E capsule 400 Units (has no administration in time range)  enoxaparin (LOVENOX) injection 40 mg (has no administration in time range)  acetaminophen (TYLENOL) tablet 650 mg (has no administration in time range)    Or  acetaminophen (TYLENOL) suppository 650 mg (has no administration in time range)  ondansetron (ZOFRAN) tablet 4 mg (has no administration in time range)    Or  ondansetron (ZOFRAN) injection 4 mg (has no administration in time range)  ondansetron (ZOFRAN) injection 4 mg (4 mg Intravenous Given 06/19/20 0834)    Vitals:   06/19/20 0845 06/19/20 0900 06/19/20 0915 06/19/20 0930  BP: (!) 142/66 137/71 135/66 130/66  Pulse: 81 82 77 82  Resp: (!) 23 16 18 20   Temp:      TempSrc:      SpO2: 96% 96% 96% 98%  Weight:      Height:        Final diagnoses:  Transient alteration of awareness    Admission/ observation were discussed with the admitting physician, patient and/or family and they are comfortable with the plan.     Final Clinical Impression(s) / ED Diagnoses Final diagnoses:  Transient alteration of awareness    Rx / DC Orders ED Discharge Orders    None       Deno Etienne, DO 06/19/20 1042

## 2020-06-19 NOTE — ED Notes (Signed)
This RN attempted to call pts husband but was sent straight to Voicemail.

## 2020-06-19 NOTE — Consult Note (Addendum)
NEURO HOSPITALIST CONSULT NOTE   Requestig physician: Dr. Tyrone Nine  Reason for Consult: AMS  History obtained from:  EMS and Chart     HPI:                                                                                                                                          Allison Roberson is an 79 y.o. female with a history of TIA x 3, remote history of uterine cancer, HLD and hypothyroidism who presents with AMS. LKN was 10:00 PM last night. When family saw her this AM, she was altered. 911 was called. On EMS arrival, her BP was 132/72 with HR 98 and O2 sat 84%. CBG was 146. Code Stroke was called in the field. On arrival to the ED, she was confused, with hypophonic speech, but without focal motor deficit, gaze deviation or facial droop. No clear aphasia was noted in the context of increased latencies of motor and verbal responses to commands and questions.   She was seen by Dr. Tomi Likens in October of 2020 as a new patient visit for post-stroke evaluation. Per Dr. Georgie Chard note, she had a similar spell in June of last year: "On 05/20/2019, she was sleeping in bed when her husband heard change in breathing.  He was unable to wake her up and he couldn't.  No associated shaking, incontinence or tongue biting.  EMS was contacted and she was brought to [facility].  In the ED, she appeared confused and did not remember the episode.  CBC, electrolytes, troponin and EKG were unremarkable.  She was admitted for further evaluation.  She had an apnea link performed which did not show any episodes of apnea but her O2 saturation dropped below 80% several times.  CT chest showed questionable atelectasis versus infiltrate in upper lobes.  She did not exhibit any fever or upper respiratory symptoms but was started on empiric IV antibiotics and Lasix.  She demonstrated no further nocturnal hypoxia.  CT of head was negative for acute intracranial abnormality.  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  No definitive diagnosis was established.  She was discharged with home oxygen and recommendation for outpatient sleep study and neurology follow up.  She has not had any recurrent events." Working syndromic diagnosis was an episode of hypoxia with unresponsiveness. Dr. Tomi Likens did not think that it was a CVA, although he noted her chronic cerebellar infarcts on MRI. An EEG was obtained, which revealed intermittent left temporal sharply-contoured slowing but no clear epileptiform discharges.  A 24 hour ambulatory EEG was then obtained from 10/28/2019 to 10/29/2019, which was normal.    No  records are found in [place] of her having had a sleep study.   Home medications include ASA.   Past Medical History:  Diagnosis Date   Allergy    Arthritis    hands   Blood transfusion without reported diagnosis 1963   with child birth   Cancer [facility])    uterine cancer - age 83    Cataract    small, watching    GERD (gastroesophageal reflux disease)    Heart murmur    with pregnancy- none noted after pregnancies    Hyperlipidemia    Osteopenia    gets prolia every 6 months    Stroke [facility])    TIA x 3 per pt- 05-2019 last episode - per note in care every where/ neuro note not CVA but old infarts noted on testing    Thyroid disease     Past Surgical History:  Procedure Laterality Date   ABDOMINAL HYSTERECTOMY  1969   COLONOSCOPY  08/05/2006   tic, moderate    PARTIAL HYSTERECTOMY     has ovaries   POLYPECTOMY      Family History  Problem Relation Age of Onset   Colon polyps Son    Colon cancer Neg Hx    Rectal cancer Neg Hx    Stomach cancer Neg Hx    Pancreatic cancer Neg Hx    Esophageal cancer Neg Hx               Social History:  reports that she has never smoked. She has never used smokeless  tobacco. She reports that she does not drink alcohol and does not use drugs.  No Known Allergies  MEDICATIONS:                                                                                                                      Current Facility-Administered Medications on File Prior to Encounter  Medication Dose Route Frequency Provider Last Rate Last Admin   0.9 %  sodium chloride infusion  500 mL Intravenous Once Doran Stabler, MD       Current Outpatient Medications on File Prior to Encounter  Medication Sig Dispense Refill   Ascorbic Acid (VITAMIN C) 1000 MG tablet Take by mouth.     aspirin 81 MG chewable tablet Chew by mouth daily.     Cholecalciferol (VITAMIN D3) 400 units CAPS Take by mouth.     Cyanocobalamin (VITAMIN B 12 PO) Take 1,000 mg by mouth daily.     denosumab (PROLIA) 60 MG/ML SOLN injection Inject into the skin.     folic acid (FOLVITE) 417 MCG tablet Take by mouth.     Garlic 4081 MG CAPS Take by mouth.     Glucosamine-Chondroitin 250-200 MG CAPS Take by mouth.     levothyroxine (SYNTHROID) 50 MCG tablet TAKE 1 TABLET(50 MCG) BY MOUTH DAILY (Patient taking differently: Take 50 mcg by mouth daily. ) 90 tablet 3   Multiple Vitamins-Minerals (CENTRUM SILVER) tablet Take by  mouth.     Omega-3 Fatty Acids (FISH OIL) 1000 MG CAPS Take 1,000 mg by mouth daily.     vitamin E 400 UNIT capsule Take by mouth.      ROS:                                                                                                                                       Unreliable in the context of confusion, but does not endorse any complaints at the time of follow up exam.    There were no vitals taken for this visit.   General Examination:                                                                                                       Physical Exam  HEENT-  [place]/AT. Bite to left side of tongue is noted.     Lungs- Respirations unlabored Extremities- No  edema  Neurological Examination Mental Status: Initially with decreased level of consciousness on arrival. Improved after CT and initial management in the ED. The findings below are from follow up exam at 11:40 AM.  Awake but drowsy with mildly decreased level of alertness and mild confusion. Oriented to day and month, but not year. Able to state her current location and describe what her husband told her of her prior spell. Speech is fluent with intact comprehension. Repetition intact. Able to name common objects but had difficulty naming a badge. Speech is nondysarthric but hypophonic. Cranial Nerves: II: Visual fields intact with no extinction to DSS. PERRL.   III,IV, VI: No ptosis. EOMI.  V,VII: Smile symmetric, facial temp sensation equal bilaterally VIII: hearing intact to voice IX,X: Hypophonic speech XI: Head is midline XII: Midline tongue extension Motor: Right : Upper extremity   5/5    Left:     Upper extremity   5/5  Lower extremity   5/5     Lower extremity   5/5 No pronator drift.  Sensory: Temp and light touch intact throughout, bilaterally. No extinction to DSS.  Deep Tendon Reflexes: 2+ and symmetric throughout Plantars: Right: downgoing   Left: downgoing Cerebellar: No ataxia with FNF bilaterally  Gait: Deferred   Lab Results: Basic Metabolic Panel: Recent Labs  Lab 06/19/20 0715  NA 141  K 4.0  CL 103  GLUCOSE 127*  BUN 16  CREATININE 0.80    CBC: Recent Labs  Lab 06/19/20 0711 06/19/20 0715  WBC 7.7  --   NEUTROABS 2.3  --  HGB 14.7 15.3*  HCT 45.4 45.0  MCV 95.6  --   PLT 218  --     Cardiac Enzymes: No results for input(s): CKTOTAL, CKMB, CKMBINDEX, TROPONINI in the last 168 hours.  Lipid Panel: No results for input(s): CHOL, TRIG, HDL, CHOLHDL, VLDL, LDLCALC in the last 168 hours.  Imaging: CT HEAD CODE STROKE WO CONTRAST  Result Date: 06/19/2020 CLINICAL DATA:  Code stroke. 79 year old female with left arm weakness and aphasia. EXAM:  CT HEAD WITHOUT CONTRAST TECHNIQUE: Contiguous axial images were obtained from the base of the skull through the vertex without intravenous contrast. COMPARISON:  None. FINDINGS: Brain: Perisylvian cerebral volume loss. Chronic appearing small to moderate sized infarct in the right cerebellum on series 3, image 11. Patchy asymmetric mostly superior frontal and parietal lobe white matter hypodensity. No acute intracranial hemorrhage identified. No midline shift, mass effect, or evidence of intracranial mass lesion. No ventriculomegaly. No cortically based acute infarct identified. No cerebral cortex encephalomalacia identified. Vascular: Calcified atherosclerosis at the skull base. No suspicious intracranial vascular hyperdensity. Skull: No acute osseous abnormality identified. Sinuses/Orbits: Visible paranasal sinuses are well pneumatized. Tympanic cavities and mastoids are clear. Other: Visualized orbits and scalp soft tissues are within normal limits. ASPECTS [facility] Stroke Program Early CT Score) Total score (0-10 with 10 being normal): 10 IMPRESSION: 1. No acute cortically based infarct or acute intracranial hemorrhage identified. ASPECTS 10. 2. Chronic appearing right cerebellar infarct. Mild to moderate for age white matter changes. 3. These results were communicated to Dr. Cheral Marker at 7:27 am on 06/19/2020 by text page via the [facility] messaging system. Electronically Signed   By: Genevie Ann M.D.   On: 06/19/2020 07:28    Assessment: 79 year old female presenting with AMS after husband woke up hearing abnormal breathing and noted her to be unresponsive.  1. Had a similar spell in June of 2020. EEG at follow up neurology visit revealed intermittent left temporal sharply-contoured slowing but no clear epileptiform discharges.  2. Prior MRI brain showed chronic cerebellar infarctions.  3. Exam reveals improving level of consciousness. AMS on presentation felt most likely to be due to postictal state. Tongue bite  is noted. Overall history and current presentation are most consistent with nocturnal seizures.  4. Possible sleep apnea   Recommendations: 1. EEG (ordered) 2. MRI brain with and without contrast. Thin-slice coronal FLAIR and T2-weighted images to assess for possible hippocampal sclerosis (ordered) 3. TSH level 4. Outpatient sleep study to assess for central versus obstructive sleep apnea, as well as nocturnal seizures.  5. Load with Keppra 2000 mg IV x 1, then start scheduled dosing at 500 mg po BID (ordered).  6. Family can be contacted at the following numbers: [phone] or [phone]. 7. Inpatient seizure precautions.  8. Outpatient seizure precautions. Per [facility] statutes, patients with seizures are not allowed to drive until  they have been seizure-free for six months. Use caution when using heavy equipment or power tools. Avoid working on ladders or at heights. Take showers instead of baths. Ensure the water temperature is not too high on the home water heater. Do not go swimming alone. When caring for infants or small children, sit down when holding, feeding, or changing them to minimize risk of injury to the child in the event you have a seizure. Also, Maintain good sleep hygiene. Avoid alcohol.  Addendum:  -- MRI brain: Motion degraded examination. This includes moderate to moderately severe motion degradation of the T1 weighted postcontrast imaging. Within  this limitation, there is no evidence of acute intracranial abnormality. No specific seizure focus is identified. 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 Background mild chronic small vessel ischemic disease.   -- EEG: Normal EEG for age.   Electronically signed: Dr. Kerney Elbe 06/19/2020, 7:36 AM

## 2020-06-19 NOTE — H&P (Signed)
History and Physical    Allison Roberson [id number]:[id number] DOB: 27-Jan-1941 DOA: 06/19/2020  PCP: Martinique, Betty G, MD (Confirm with patient/family/NH records and if not entered, this has to be entered at [facility] point of entry) Patient coming from: Home  I have personally briefly reviewed patient's old medical records in [place]  Chief Complaint: Feeling weak  HPI: Allison Roberson is a 79 y.o. female with medical history significant of TIA x 3, remote history of uterine cancer, HLD and hypothyroidism who presents with AMS. Last seen normal, around 10:00 PM last night, husband found patient unresponsive, not breathing.  Husband started CPR and called 911. On EMS arrival, her BP HR within normal limits and O2 sat 84%. CBG was 146. Code Stroke was called.  Patient was awake when EMS arrived, patient remember he was feeling sick and weak last night.  And he admitted biting her tongue but no urinary or BM incontinence.  She denied any headache no blurry vision but has some soreness of her neck and shoulders. ED Course: On arrival to the ED, she was confused, with hypophonic speech, but without focal motor deficit, gaze deviation or facial droop. No clear aphasia was noted in the context of increased latencies of motor and verbal responses to commands and questions.     Review of Systems: As per HPI otherwise 10 point review of systems negative.    Past Medical History:  Diagnosis Date  . Allergy   . Arthritis    hands  . Blood transfusion without reported diagnosis 1963   with child birth  . Cancer [facility])    uterine cancer - age 10   . Cataract    small, watching   . GERD (gastroesophageal reflux disease)   . Heart murmur    with pregnancy- none noted after pregnancies   . Hyperlipidemia   . Osteopenia    gets prolia every 6 months   . Stroke [facility])    TIA x 3 per pt- 05-2019 last episode - per note in care every where/ neuro note not CVA but old infarts noted on testing   . Thyroid  disease     Past Surgical History:  Procedure Laterality Date  . ABDOMINAL HYSTERECTOMY  1969  . COLONOSCOPY  08/05/2006   tic, moderate   . PARTIAL HYSTERECTOMY     has ovaries  . POLYPECTOMY       reports that she has never smoked. She has never used smokeless tobacco. She reports that she does not drink alcohol and does not use drugs.  No Known Allergies  Family History  Problem Relation Age of Onset  . Colon polyps Son   . Colon cancer Neg Hx   . Rectal cancer Neg Hx   . Stomach cancer Neg Hx   . Pancreatic cancer Neg Hx   . Esophageal cancer Neg Hx      Prior to Admission medications   Medication Sig Start Date End Date Taking? Authorizing Provider  Ascorbic Acid (VITAMIN C) 1000 MG tablet Take 1,000 mg by mouth daily.    Yes [provider]  aspirin 81 MG chewable tablet Chew 81 mg by mouth daily.    Yes [provider]  Cholecalciferol (VITAMIN D3) 400 units CAPS Take 400 Units by mouth daily.    Yes [provider]  Cyanocobalamin (VITAMIN B 12 PO) Take 1,000 mg by mouth daily.   Yes [provider]  folic acid (FOLVITE) 967 MCG tablet Take 400  mcg by mouth daily.    Yes [provider]  Garlic 6010 MG CAPS Take 1,000 mg by mouth daily.    Yes [provider]  Glucosamine-Chondroitin (GLUCOSAMINE CHONDR COMPLEX PO) Take 1 tablet by mouth at bedtime.   Yes [provider]  levothyroxine (SYNTHROID) 50 MCG tablet TAKE 1 TABLET(50 MCG) BY MOUTH DAILY Patient taking differently: Take 50 mcg by mouth daily.  01/25/20  Yes Martinique, Betty G, MD  Multiple Vitamins-Minerals (CENTRUM SILVER) tablet Take 1 tablet by mouth at bedtime.    Yes [provider]  Omega-3 Fatty Acids (FISH OIL) 1000 MG CAPS Take 1,000 mg by mouth daily.   Yes [provider]  vitamin E 400 UNIT capsule Take 400 Units by mouth daily.    Yes [provider]    Physical Exam: Vitals:   06/19/20 1103 06/19/20 1223  06/19/20 1235 06/19/20 1403  BP: 135/67 120/63 (!) 107/95 (!) 129/56  Pulse: 76 78 72 73  Resp: 19 16  16   Temp:  98.1 F (36.7 C)    TempSrc:  Oral    SpO2: 96% 95% 95% 94%  Weight:      Height:        Constitutional: NAD, calm, comfortable Vitals:   06/19/20 1103 06/19/20 1223 06/19/20 1235 06/19/20 1403  BP: 135/67 120/63 (!) 107/95 (!) 129/56  Pulse: 76 78 72 73  Resp: 19 16  16   Temp:  98.1 F (36.7 C)    TempSrc:  Oral    SpO2: 96% 95% 95% 94%  Weight:      Height:       Eyes: PERRL, lids and conjunctivae normal ENMT: Mucous membranes are moist. Posterior pharynx clear of any exudate or lesions.Normal dentition.  Neck: normal, supple, no masses, no thyromegaly Respiratory: clear to auscultation bilaterally, no wheezing, no crackles. Normal respiratory effort. No accessory muscle use.  Cardiovascular: Regular rate and rhythm, no murmurs / rubs / gallops. No extremity edema. 2+ pedal pulses. No carotid bruits.  Abdomen: no tenderness, no masses palpated. No hepatosplenomegaly. Bowel sounds positive.  Musculoskeletal: no clubbing / cyanosis. No joint deformity upper and lower extremities. Good ROM, no contractures. Normal muscle tone.  Skin: no rashes, lesions, ulcers. No induration Neurologic: CN 2-12 grossly intact. Sensation intact, DTR normal. Strength 5/5 in all 4.  Psychiatric: Normal judgment and insight. Alert and oriented x 3. Normal mood.   (Anything < 9 systems with 2 bullets each down codes to level 1) (If patient refuses exam can't bill higher level) (Make sure to document decubitus ulcers present on admission -- if possible -- and whether patient has chronic indwelling catheter at time of admission)  Labs on Admission: I have personally reviewed following labs and imaging studies  CBC: Recent Labs  Lab 06/19/20 0711 06/19/20 0715  WBC 7.7  --   NEUTROABS 2.3  --   HGB 14.7 15.3*  HCT 45.4 45.0  MCV 95.6  --   PLT 218  --    Basic Metabolic  Panel: Recent Labs  Lab 06/19/20 0711 06/19/20 0715  NA 137 141  K 4.1 4.0  CL 103 103  CO2 21*  --   GLUCOSE 128* 127*  BUN 14 16  CREATININE 0.85 0.80  CALCIUM 9.6  --    GFR: Estimated Creatinine Clearance: 44.4 mL/min (by C-G formula based on SCr of 0.8 mg/dL). Liver Function Tests: Recent Labs  Lab 06/19/20 0711  AST 33  ALT 18  ALKPHOS 112  BILITOT 0.8  PROT 6.6  ALBUMIN 3.6   No results for input(s): LIPASE, AMYLASE in the last 168 hours. No results for input(s): AMMONIA in the last 168 hours. Coagulation Profile: Recent Labs  Lab 06/19/20 0711  INR 0.9   Cardiac Enzymes: No results for input(s): CKTOTAL, CKMB, CKMBINDEX, TROPONINI in the last 168 hours. BNP (last 3 results) No results for input(s): PROBNP in the last 8760 hours. HbA1C: No results for input(s): HGBA1C in the last 72 hours. CBG: Recent Labs  Lab 06/19/20 0710  GLUCAP 124*   Lipid Profile: No results for input(s): CHOL, HDL, LDLCALC, TRIG, CHOLHDL, LDLDIRECT in the last 72 hours. Thyroid Function Tests: No results for input(s): TSH, T4TOTAL, FREET4, T3FREE, THYROIDAB in the last 72 hours. Anemia Panel: No results for input(s): VITAMINB12, FOLATE, FERRITIN, TIBC, IRON, RETICCTPCT in the last 72 hours. Urine analysis:    Component Value Date/Time   COLORURINE STRAW (A) 06/19/2020 0835   APPEARANCEUR CLEAR 06/19/2020 0835   LABSPEC 1.009 06/19/2020 0835   PHURINE 7.0 06/19/2020 0835   GLUCOSEU NEGATIVE 06/19/2020 0835   HGBUR NEGATIVE 06/19/2020 0835   BILIRUBINUR NEGATIVE 06/19/2020 0835   BILIRUBINUR 2+ 04/29/2019 1421   KETONESUR NEGATIVE 06/19/2020 0835   PROTEINUR NEGATIVE 06/19/2020 0835   UROBILINOGEN 2.0 (A) 04/29/2019 1421   NITRITE NEGATIVE 06/19/2020 0835   LEUKOCYTESUR NEGATIVE 06/19/2020 0835    Radiological Exams on Admission: DG Chest Port 1 View  Result Date: 06/19/2020 CLINICAL DATA:  Altered mental status EXAM: PORTABLE CHEST 1 VIEW COMPARISON:  None.  FINDINGS: The heart size and mediastinal contours are within normal limits. Increased pulmonary interstitium is identified bilaterally. There are question small nodules left peri and suprahilar region. There is no focal pneumonia or pleural effusion. The visualized skeletal structures are stable. IMPRESSION: Increased pulmonary interstitium bilaterally. This can be seen in interstitial edema or viral infection. There are question small nodules left peri and suprahilar region. Recommend further evaluation with chest CT on outpatient basis. Electronically Signed   By: Abelardo Diesel M.D.   On: 06/19/2020 08:06   CT HEAD CODE STROKE WO CONTRAST  Result Date: 06/19/2020 CLINICAL DATA:  Code stroke. 79 year old female with left arm weakness and aphasia. EXAM: CT HEAD WITHOUT CONTRAST TECHNIQUE: Contiguous axial images were obtained from the base of the skull through the vertex without intravenous contrast. COMPARISON:  None. FINDINGS: Brain: Perisylvian cerebral volume loss. Chronic appearing small to moderate sized infarct in the right cerebellum on series 3, image 11. Patchy asymmetric mostly superior frontal and parietal lobe white matter hypodensity. No acute intracranial hemorrhage identified. No midline shift, mass effect, or evidence of intracranial mass lesion. No ventriculomegaly. No cortically based acute infarct identified. No cerebral cortex encephalomalacia identified. Vascular: Calcified atherosclerosis at the skull base. No suspicious intracranial vascular hyperdensity. Skull: No acute osseous abnormality identified. Sinuses/Orbits: Visible paranasal sinuses are well pneumatized. Tympanic cavities and mastoids are clear. Other: Visualized orbits and scalp soft tissues are within normal limits. ASPECTS [facility] Stroke Program Early CT Score) Total score (0-10 with 10 being normal): 10 IMPRESSION: 1. No acute cortically based infarct or acute intracranial hemorrhage identified. ASPECTS 10. 2. Chronic  appearing right cerebellar infarct. Mild to moderate for age white matter changes. 3. These results were communicated to Dr. Cheral Marker at 7:27 am on 06/19/2020 by text page via the [facility] messaging system. Electronically Signed   By: Genevie Ann M.D.   On: 06/19/2020 07:28    EKG: Independently reviewed.  Normal sinus rhythm  Assessment/Plan Active Problems:   Seizure ([place])   AMS (altered mental status)  (please populate well all problems here in Problem List. (For example, if patient is on BP meds at home and you resume or decide to hold them, it is a problem that needs to be her. Same for CAD, COPD, HLD and so on)  Acute metabolic encephalopathy, resolved -According to neurology, who followed patient as outpatient, patient had a similar episode in 2016.  Patient was worked up for new onset seizures at point but no significant finding on EEG and 24-hour ambulatory EEG. -EEG done at bedside today and result pending, as per neurology, as of now no plan to start antiseizure medication before EEG resolved. -Neuro check, seizure precautions -Brain MRI next -PT evaluation  Hypothyroidism -Continue Synthroid  DVT prophylaxis: Lovenox Code Status: Full code Family Communication: None at bedside Disposition Plan: Likely can be discharged in the next 24 to 48 hours depends on neurology work-up and clinical progress and PT evaluation. Consults called: Neurology Admission status: Tele admit   Lequita Halt MD [facility] Pager [phone]    06/19/2020, 2:52 PM

## 2020-06-19 NOTE — ED Notes (Signed)
Lunch Tray Ordered @ 1040. 

## 2020-06-19 NOTE — ED Triage Notes (Signed)
Pt arrives [facility] as Code Stroke. LKW 2200 last night. Left arm weakness and asaphia. EMS reports family doing CPR on pt upon arrival to the home. EMS stated pt had a pulse and CPR was stopped. EMS states pt was unresponsive then became combative but was getting words out. Pt Alert to self at this time.

## 2020-06-19 NOTE — Progress Notes (Signed)
EEG completed, results pending. 

## 2020-06-19 NOTE — ED Notes (Signed)
This RN attempted to call report to [phone]

## 2020-06-20 ENCOUNTER — Inpatient Hospital Stay (HOSPITAL_COMMUNITY): Payer: Medicare Other

## 2020-06-20 DIAGNOSIS — R569 Unspecified convulsions: Secondary | ICD-10-CM | POA: Diagnosis not present

## 2020-06-20 DIAGNOSIS — R4182 Altered mental status, unspecified: Secondary | ICD-10-CM | POA: Diagnosis not present

## 2020-06-20 MED ORDER — LEVETIRACETAM 500 MG PO TABS: 500.0000 mg | ORAL_TABLET | Freq: Two times a day (BID) | ORAL | 1 refills | Status: DC

## 2020-06-20 NOTE — Care Management Obs Status (Signed)
[place] NOTIFICATION   Patient Details  Name: Eulonda Andalon MRN: [id number] Date of Birth: [birth year]   Medicare Observation Status Notification Given:  Yes    Pollie Friar, RN 06/20/2020, 12:05 PM

## 2020-06-20 NOTE — Progress Notes (Signed)
Discharge instructions gone over with patient and family at bedside. All questions answered. IV removed and telemetry discontinued. Patient transported off unit via wheelchair for transport home by husband. Gwendolyn Grant, RN

## 2020-06-20 NOTE — Care Management CC44 (Signed)
Condition Code 44 Documentation Completed  Patient Details  Name: Allison Roberson MRN: [id number] Date of Birth: 01-05-1941   Condition Code 44 given:  Yes Patient signature on Condition Code 44 notice:  Yes Documentation of 2 MD's agreement:  Yes Code 44 added to claim:  Yes    Pollie Friar, RN 06/20/2020, 12:05 PM

## 2020-06-20 NOTE — Progress Notes (Signed)
Modified Barium Swallow Progress Note  Patient Details  Name: Allison Roberson MRN: [id number] Date of Birth: [birth year]  Today's Date: 06/20/2020  Modified Barium Swallow completed.  Full report located under Chart Review in the Imaging Section.  Brief recommendations include the following:  Clinical Impression  Pt was seen in radiology suite for modified barium swallow study. Trials of puree solids, regular texture solids, a 80mm barium tablet, and thin liquids via cup and straw were administered. Pt's oropharyngeal swallow mechanism was within normal limits. A single instance of transient penetration (PAS 2) of thin liquids was noted during deglutition of the barium tablet but this is considered WNL and no other instances of laryngeal invasion were noted. It is recommended that a regular texture diet with thin liquids be continued at this time and further skilled SLP services are not clinically indicated.    Swallow Evaluation Recommendations       SLP Diet Recommendations: Regular solids;Thin liquid   Liquid Administration via: Cup;Straw   Medication Administration: Whole meds with puree   Supervision: Patient able to self feed   Compensations: Slow rate;Small sips/bites       Oral Care Recommendations: Oral care BID      Natasja Niday I. Hardin Negus, [place], [place] Office number [phone] Pager [phone]   Horton Marshall 06/20/2020,1:28 PM

## 2020-06-20 NOTE — Evaluation (Signed)
Clinical/Bedside Swallow Evaluation Patient Details  Name: Allison Roberson MRN: [id number] Date of Birth: Sep 18, 1941  Today's Date: 06/20/2020 Time: SLP Start Time (ACUTE ONLY): 0950 SLP Stop Time (ACUTE ONLY): 1006 SLP Time Calculation (min) (ACUTE ONLY): 16 min  Past Medical History:  Past Medical History:  Diagnosis Date  . Allergy   . Arthritis    hands  . Blood transfusion without reported diagnosis 1963   with child birth  . Cancer [facility])    uterine cancer - age 21   . Cataract    small, watching   . GERD (gastroesophageal reflux disease)   . Heart murmur    with pregnancy- none noted after pregnancies   . Hyperlipidemia   . Osteopenia    gets prolia every 6 months   . 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 Thyroid disease    Past Surgical History:  Past Surgical History:  Procedure Laterality Date  . ABDOMINAL HYSTERECTOMY  1969  . COLONOSCOPY  08/05/2006   tic, moderate   . PARTIAL HYSTERECTOMY     has ovaries  . POLYPECTOMY     HPI:  Pt is a 79 y.o. female with medical history significant of TIA x 3, remote history of uterine cancer, HLD and hypothyroidism who presented with AMS. Her husband found her unresponsive, not breathing and CPR and called 911. Pt was awake upon EMS' arrival. On arrival to the ED, she was confused, with hypophonic speech, but without focal motor deficit, gaze deviation or facial droop. 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 No acute changes. Esophagaram 10/29/17: Small hiatal hernia. Mild mucosal ring in the distal esophagus which does not delay passage of he 13 mm barium tablet.   Assessment / Plan / Recommendation Clinical Impression  Pt was seen for bedside swallow evaluation and she reported that she "gets choked" with solids and liquids and "sticking" at level of sternal notch. Oral mechanism  exam was [facility]. No symptoms of oropharyngeal dyspahgia were noted during this evaluation. However, considering her reports, a modified barium swallow study will be conducted to assess physiology.  SLP Visit Diagnosis: Dysphagia, unspecified (R13.10)    Aspiration Risk  No limitations    Diet Recommendation Regular;Thin liquid   Liquid Administration via: Cup;Straw Medication Administration: Whole meds with liquid Supervision: Patient able to self feed Compensations: Slow rate;Small sips/bites Postural Changes: Seated upright at 90 degrees    Other  Recommendations Oral Care Recommendations: Oral care BID   Follow up Recommendations None      Frequency and Duration min 2x/week  1 week       Prognosis        Swallow Study   General Date of Onset: 06/19/20 HPI: Pt is a 79 y.o. female with medical history significant of TIA x 3, remote history of uterine cancer, HLD and hypothyroidism who presented with AMS. Her husband found her unresponsive, not breathing and CPR and called 911. Pt was awake upon EMS' arrival. On arrival to the ED, she was confused, with hypophonic speech, but without focal motor deficit, gaze deviation or facial droop. 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 No acute changes. Esophagaram 10/29/17: Small hiatal hernia. Mild mucosal ring in the distal esophagus which does not delay passage of he 13 mm barium tablet. Type  of Study: Bedside Swallow Evaluation Previous Swallow Assessment: None Diet Prior to this Study: Regular;Thin liquids Temperature Spikes Noted: No Respiratory Status: Room air History of Recent Intubation: No Behavior/Cognition: Alert;Cooperative;Pleasant mood Oral Cavity Assessment: Within Functional Limits Oral Care Completed by SLP: No Oral Cavity - Dentition: Adequate natural dentition Vision: Functional for self-feeding Self-Feeding Abilities: Able to feed self Patient Positioning: Upright in  bed;Postural control adequate for testing Baseline Vocal Quality: Normal Volitional Cough: Strong Volitional Swallow: Able to elicit    Oral/Motor/Sensory Function Overall Oral Motor/Sensory Function: Within functional limits   Ice Chips Presentation: Spoon   Thin Liquid Thin Liquid: Within functional limits Presentation: Straw    Nectar Thick Nectar Thick Liquid: Not tested   Honey Thick Honey Thick Liquid: Not tested   Puree Puree: Within functional limits Presentation: Spoon   Solid     Solid: Within functional limits Presentation: Honor I. Hardin Negus, [place], [place] Office number [phone] Pager [phone]  Horton Marshall 06/20/2020,12:19 PM

## 2020-06-20 NOTE — Evaluation (Signed)
Physical Therapy Evaluation Patient Details Name: Allison Roberson MRN: [id number] DOB: 05-01-1941 Today's Date: 06/20/2020   History of Present Illness  79 y.o. female with medical history significant of TIA x 3, remote history of uterine cancer, HLD and hypothyroidism who presents with AMS.    Clinical Impression  PT eval complete. Pt required supervision transfers and ambulation 300' without AD. No LOB noted. Ascend/descend 5 steps without rail min guard assist. Pt reports she is at her baseline for mobility. No further PT intervention indicated. PT signing off.    Follow Up Recommendations No PT follow up    Equipment Recommendations  None recommended by PT    Recommendations for Other Services       Precautions / Restrictions Precautions Precautions: Fall Restrictions Weight Bearing Restrictions: No      Mobility  Bed Mobility Overal bed mobility: Independent                Transfers Overall transfer level: Needs assistance Equipment used: None Transfers: Sit to/from Stand;Stand Pivot Transfers Sit to Stand: Supervision Stand pivot transfers: Supervision       General transfer comment: supervision for safety, no physical assist  Ambulation/Gait Ambulation/Gait assistance: Supervision Gait Distance (Feet): 300 Feet Assistive device: None Gait Pattern/deviations: Step-through pattern;Decreased stride length Gait velocity: decreased Gait velocity interpretation: <1.31 ft/sec, indicative of household ambulator General Gait Details: steady gait, no LOB  Stairs Stairs: Yes Stairs assistance: Min guard Stair Management: No rails;Forwards;Alternating pattern Number of Stairs: 5    Wheelchair Mobility    Modified Rankin (Stroke Patients Only)       Balance Overall balance assessment: Mild deficits observed, not formally tested                                           Pertinent Vitals/Pain Pain Assessment: No/denies pain     Home Living Family/patient expects to be discharged to:: Private residence Living Arrangements: Spouse/significant other Available Help at Discharge: Family;Available 24 hours/day Type of Home: House Home Access: Stairs to enter Entrance Stairs-Rails: None Entrance Stairs-Number of Steps: 2 Home Layout: Two level;Able to live on main level with bedroom/bathroom Home Equipment: Shower seat      Prior Function Level of Independence: Independent               Hand Dominance        Extremity/Trunk Assessment   Upper Extremity Assessment Upper Extremity Assessment: Overall WFL for tasks assessed    Lower Extremity Assessment Lower Extremity Assessment: Overall WFL for tasks assessed    Cervical / Trunk Assessment Cervical / Trunk Assessment: Normal  Communication   Communication: No difficulties  Cognition Arousal/Alertness: Awake/alert Behavior During Therapy: WFL for tasks assessed/performed Overall Cognitive Status: Within Functional Limits for tasks assessed                                        General Comments General comments (skin integrity, edema, etc.): VSS    Exercises     Assessment/Plan    PT Assessment Patent does not need any further PT services  PT Problem List         PT Treatment Interventions      PT Goals (Current goals can be found in the Care Plan section)  Acute Rehab PT Goals  Patient Stated Goal: home PT Goal Formulation: All assessment and education complete, DC therapy    Frequency     Barriers to discharge        Co-evaluation               AM-PAC PT "6 Clicks" Mobility  Outcome Measure Help needed turning from your back to your side while in a flat bed without using bedrails?: None Help needed moving from lying on your back to sitting on the side of a flat bed without using bedrails?: None Help needed moving to and from a bed to a chair (including a wheelchair)?: None Help needed standing  up from a chair using your arms (e.g., wheelchair or bedside chair)?: None Help needed to walk in hospital room?: None Help needed climbing 3-5 steps with a railing? : A Little 6 Click Score: 23    End of Session Equipment Utilized During Treatment: Gait belt Activity Tolerance: Patient tolerated treatment well Patient left: in bed;with call bell/phone within reach;with bed alarm set Nurse Communication: Mobility status PT Visit Diagnosis: Unsteadiness on feet (R26.81)    Time: 7425-9563 PT Time Calculation (min) (ACUTE ONLY): 13 min   Charges:   PT Evaluation $PT Eval Low Complexity: 1 Low          Lorrin Goodell, PT  Office # [phone] Pager [phone]   Allison Roberson 06/20/2020, 10:02 AM

## 2020-06-20 NOTE — Discharge Summary (Addendum)
Physician Discharge Summary  Allison Roberson AJO:[id number] DOB: 19-Jul-1941 DOA: 06/19/2020  PCP: Martinique, Betty G, MD  Admit date: 06/19/2020 Discharge date: 06/20/2020  Admitted From: Home Disposition:  Home  Discharge Condition:Stable CODE STATUS:FULL Diet recommendation: Regular   Brief/Interim Summary: Patient is a 79 year old female with history of TIA, remote history of uterine cancer, hyperlipidemia, hypothyroidism who presented with altered mental status.  Her husband found her unresponsive, not breathing.  Husband started CPR.  Called 911.  On presentation she was hemodynamically stable.  Code stroke was called.  Patient was awake when EMS arrived.  On presentation, she was confused but without any focal neurological deficits.  She was following commands.  Admitted for further evaluation.  Neurology following.  She was suspected to have seizures.  EEG did not show any clear seizure activity.  Started on Keppra 500mg  twice a day.  Currently she is completely alert and oriented.  Denies any complaints.  Seen by PT and no follow-up recommended.  Cleared by neurology for discharge.  She is medically stable for discharge to home today.  Since to follow-up with neurology as an outpatient.  Following problems were addressed during her hospitalization:  Seizures/Altered mental status: As per previous records, patient had similar episode in 2016.  EEG has been ordered here for the suspicion of seizures but did not show any epileptiform discharges.  Neurology was  following.  CT head did not show any acute intracranial abnormalities.  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 She has been started on Keppra for the suspicion of seizures. Currently she is completely alert and oriented.  Denies any complaints.  Seen by PT and no follow-up recommended.  Cleared by neurology for  discharge.  Hypothyroidism: Continue Synthyroid  Generalized weakness: PT/OT consulted.  No follow-up recommended.  Discharge Diagnoses:  Active Problems:   Seizure ([place])   AMS (altered mental status)    Discharge Instructions  Discharge Instructions    Ambulatory referral to Neurology   Complete by: As directed    An appointment is requested in approximately: 4 weeks   Diet - low sodium heart healthy   Complete by: As directed    Discharge instructions   Complete by: As directed    1)Please follow up with your PCP in a week. 2)Take prescribed medications as instructed. 3)Follow up with [facility] neurology in 4 weeks.  Name and number the provider group has been attached. 4)Follow-up with sleep specialist to check for sleep apnea 5)Per [facility] statutes, patients with seizures are not allowed to drive until  they have been seizure-free for six months. Use caution when using heavy equipment or power tools. Avoid working on ladders or at heights. Take showers instead of baths. Ensure the water temperature is not too high on the home water heater. Do not go swimming alone. When caring for infants or small children, sit down when holding, feeding, or changing them to minimize risk of injury to the child in the event you have a seizure.  Also, Maintain good sleep hygiene. Avoid alcohol.   Increase activity slowly   Complete by: As directed      Allergies as of 06/20/2020   No Known Allergies     Medication List    TAKE these medications   aspirin 81 MG chewable tablet Chew 81 mg by mouth daily.   Centrum Silver tablet Take 1 tablet by mouth at bedtime.  Fish Oil 1000 MG Caps Take 1,000 mg by mouth daily.   folic acid 833 MCG tablet Commonly known as: FOLVITE Take 400 mcg by mouth daily.   Garlic 8250 MG Caps Take 1,000 mg by mouth daily.   GLUCOSAMINE CHONDR COMPLEX PO Take 1 tablet by mouth at bedtime.   levETIRAcetam 500 MG tablet Commonly known as:  KEPPRA Take 1 tablet (500 mg total) by mouth 2 (two) times daily.   levothyroxine 50 MCG tablet Commonly known as: SYNTHROID TAKE 1 TABLET(50 MCG) BY MOUTH DAILY What changed: See the new instructions.   VITAMIN B 12 PO Take 1,000 mg by mouth daily.   vitamin C 1000 MG tablet Take 1,000 mg by mouth daily.   Vitamin D3 10 MCG (400 UNIT) Caps Take 400 Units by mouth daily.   vitamin E 180 MG (400 UNITS) capsule Take 400 Units by mouth daily.       Follow-up Information    Martinique, Betty G, MD. Schedule an appointment as soon as possible for a visit in 1 week(s).   Specialty: Family Medicine Contact information: [place] [address] [phone]        [facility]. Schedule an appointment as soon as possible for a visit in 4 week(s).   Specialty: Neurology Contact information: [address] [place] [phone]             No Known Allergies  Consultations:  Neurology   Procedures/Studies: EEG  Result Date: 06/19/2020 Philemon Kingdom, MD     06/19/2020  3:39 PM Date of Study: 06/19/20 Reason for Study: Pt is a 62  YOF with PMH of TIA x 3, remote history of uterine cancer, HLD, and hypothyroidism who presents with AMS. Eval for seizures. Description of recording: This recording was obtained using a Digital EEG system with 18 channel capacity . Standard bipolar and referential EEG montages were used following the International  10 - 20  System . This is a digitally recorded EEG with duration of approximately ~23:20 minutes. The background consists of 9-10 hz alpha rhythm which attenuates with eye opening and closure.  There were no epileptiform discharges or focal slowing noted during study.  There were no recorded electrographic seizures. Hyperventilation was not performed. Photic stimulation was not performed. Sleep did not occur in this recording.   IMPRESSION: This is a normal EEG for  age. CLINICAL CORRELATION: This EEG finding did not support the diagnosis of seizure.  Clinical correlation is advised. A normal EEG does not rule out epilepsy, which is a clinical diagnosis. I have personally reviewed this study.  MR BRAIN W WO CONTRAST  Result Date: 06/19/2020 CLINICAL DATA:  Seizure, nontraumatic. EXAM: MRI HEAD WITHOUT AND WITH CONTRAST TECHNIQUE: Multiplanar, multiecho pulse sequences of the brain and surrounding structures were obtained without and with intravenous contrast. CONTRAST:  52mL GADAVIST GADOBUTROL 1 MMOL/ML IV SOLN COMPARISON:  Noncontrast head CT performed earlier the same day 06/19/2020. FINDINGS: Brain: Multiple sequences are motion degraded limiting evaluation. Most notably, there is moderate to moderately severe motion degradation of the T1 weighted postcontrast sequences. Mild generalized parenchymal atrophy. Scattered small chronic lacunar infarcts within the cerebral white matter and pons. Background mild patchy T2/FLAIR hyperintensity within the cerebral white matter is nonspecific, but consistent with chronic small vessel ischemic disease. Additionally, there are multiple chronic infarcts within the cerebellum, the largest on the right measuring 12 mm. The hippocampi appear symmetric in size and signal. There  is no evidence of acute infarct. No evidence of intracranial mass. No chronic intracranial blood products. No extra-axial fluid collection. No midline shift. Within limitations of significant motion degradation, no abnormal intracranial enhancement is identified. Vascular: Expected proximal arterial flow voids. Skull and upper cervical spine: No focal marrow lesion. Sinuses/Orbits: Visualized orbits show no acute finding. Mild ethmoid sinus mucosal thickening. No significant mastoid effusion IMPRESSION: Motion degraded examination as described. This includes moderate to moderately severe motion degradation of the T1 weighted postcontrast imaging. Within this  limitation, there is no evidence of acute intracranial abnormality. No specific seizure focus is identified. 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 Background mild chronic small vessel ischemic disease. Mild ethmoid sinus mucosal thickening. Electronically Signed   By: Kellie Simmering DO   On: 06/19/2020 17:35   DG Chest Port 1 View  Result Date: 06/19/2020 CLINICAL DATA:  Altered mental status EXAM: PORTABLE CHEST 1 VIEW COMPARISON:  None. FINDINGS: The heart size and mediastinal contours are within normal limits. Increased pulmonary interstitium is identified bilaterally. There are question small nodules left peri and suprahilar region. There is no focal pneumonia or pleural effusion. The visualized skeletal structures are stable. IMPRESSION: Increased pulmonary interstitium bilaterally. This can be seen in interstitial edema or viral infection. There are question small nodules left peri and suprahilar region. Recommend further evaluation with chest CT on outpatient basis. Electronically Signed   By: Abelardo Diesel M.D.   On: 06/19/2020 08:06   CT HEAD CODE STROKE WO CONTRAST  Result Date: 06/19/2020 CLINICAL DATA:  Code stroke. 79 year old female with left arm weakness and aphasia. EXAM: CT HEAD WITHOUT CONTRAST TECHNIQUE: Contiguous axial images were obtained from the base of the skull through the vertex without intravenous contrast. COMPARISON:  None. FINDINGS: Brain: Perisylvian cerebral volume loss. Chronic appearing small to moderate sized infarct in the right cerebellum on series 3, image 11. Patchy asymmetric mostly superior frontal and parietal lobe white matter hypodensity. No acute intracranial hemorrhage identified. No midline shift, mass effect, or evidence of intracranial mass lesion. No ventriculomegaly. No cortically based acute infarct identified. No cerebral cortex encephalomalacia identified. Vascular: Calcified atherosclerosis at the skull base. No suspicious  intracranial vascular hyperdensity. Skull: No acute osseous abnormality identified. Sinuses/Orbits: Visible paranasal sinuses are well pneumatized. Tympanic cavities and mastoids are clear. Other: Visualized orbits and scalp soft tissues are within normal limits. ASPECTS [facility] Stroke Program Early CT Score) Total score (0-10 with 10 being normal): 10 IMPRESSION: 1. No acute cortically based infarct or acute intracranial hemorrhage identified. ASPECTS 10. 2. Chronic appearing right cerebellar infarct. Mild to moderate for age white matter changes. 3. These results were communicated to Dr. Cheral Marker at 7:27 am on 06/19/2020 by text page via the [facility] messaging system. Electronically Signed   By: Genevie Ann M.D.   On: 06/19/2020 07:28       Subjective: Patient seen and examined at the bedside this morning.  Hemodynamically stable for discharge today to home.  Discharge Exam: Vitals:   06/20/20 0910 06/20/20 1110  BP: (!) 104/54 101/63  Pulse: 78 86  Resp: 19 18  Temp: 98.8 F (37.1 C) 98.4 F (36.9 C)  SpO2: 96% 96%   Vitals:   06/20/20 0354 06/20/20 0847 06/20/20 0910 06/20/20 1110  BP: 94/60 (!) 99/56 (!) 104/54 101/63  Pulse: 72 72 78 86  Resp: 18 (!) 21 19 18   Temp: 97.8 F (36.6 C) 98.8 F (37.1 C) 98.8 F (37.1 C) 98.4 F (36.9  C)  TempSrc: Oral Oral  Oral  SpO2: 95% 94% 96% 96%  Weight:      Height:        General: Pt is alert, awake, not in acute distress Cardiovascular: RRR, S1/S2 +, no rubs, no gallops Respiratory: CTA bilaterally, no wheezing, no rhonchi Abdominal: Soft, NT, ND, bowel sounds + Extremities: no edema, no cyanosis    The results of significant diagnostics from this hospitalization (including imaging, microbiology, ancillary and laboratory) are listed below for reference.     Microbiology: Recent Results (from the past 240 hour(s))  SARS Coronavirus 2 by RT PCR (hospital order, performed in [facility] hospital lab) Nasopharyngeal Nasopharyngeal Swab      Status: None   Collection Time: 06/19/20  8:51 AM   Specimen: Nasopharyngeal Swab  Result Value Ref Range Status   SARS Coronavirus 2 NEGATIVE NEGATIVE Final    Comment: (NOTE) SARS-CoV-2 target nucleic acids are NOT DETECTED.  The SARS-CoV-2 RNA is generally detectable in upper and lower respiratory specimens during the acute phase of infection. The lowest concentration of SARS-CoV-2 viral copies this assay can detect is 250 copies / mL. A negative result does not preclude SARS-CoV-2 infection and should not be used as the sole basis for treatment or other patient management decisions.  A negative result may occur with improper specimen collection / handling, submission of specimen other than nasopharyngeal swab, presence of viral mutation(s) within the areas targeted by this assay, and inadequate number of viral copies (<250 copies / mL). A negative result must be combined with clinical observations, patient history, and epidemiological information.  Fact Sheet for Patients:   StrictlyIdeas.no  Fact Sheet for Healthcare Providers: BankingDealers.co.za  This test is not yet approved or  cleared by the [place] FDA and has been authorized for detection and/or diagnosis of SARS-CoV-2 by FDA under an Emergency Use Authorization (EUA).  This EUA will remain in effect (meaning this test can be used) for the duration of the COVID-19 declaration under Section 564(b)(1) of the Act, 21 U.S.C. section 360bbb-3(b)(1), unless the authorization is terminated or revoked sooner.  Performed at [facility], [place] [address] [place], [place] [address]      Labs: BNP (last 3 results) No results for input(s): BNP in the last 8760 hours. Basic Metabolic Panel: Recent Labs  Lab 06/19/20 0711 06/19/20 0715  NA 137 141  K 4.1 4.0  CL 103 103  CO2 21*  --   GLUCOSE 128* 127*  BUN 14 16  CREATININE 0.85 0.80  CALCIUM 9.6  --     Liver Function Tests: Recent Labs  Lab 06/19/20 0711  AST 33  ALT 18  ALKPHOS 112  BILITOT 0.8  PROT 6.6  ALBUMIN 3.6   No results for input(s): LIPASE, AMYLASE in the last 168 hours. No results for input(s): AMMONIA in the last 168 hours. CBC: Recent Labs  Lab 06/19/20 0711 06/19/20 0715  WBC 7.7  --   NEUTROABS 2.3  --   HGB 14.7 15.3*  HCT 45.4 45.0  MCV 95.6  --   PLT 218  --    Cardiac Enzymes: No results for input(s): CKTOTAL, CKMB, CKMBINDEX, TROPONINI in the last 168 hours. BNP: Invalid input(s): POCBNP CBG: Recent Labs  Lab 06/19/20 0710  GLUCAP 124*   D-Dimer No results for input(s): DDIMER in the last 72 hours. Hgb A1c No results for input(s): HGBA1C in the last 72 hours. Lipid Profile No results for input(s): CHOL, HDL,  LDLCALC, TRIG, CHOLHDL, LDLDIRECT in the last 72 hours. Thyroid function studies No results for input(s): TSH, T4TOTAL, T3FREE, THYROIDAB in the last 72 hours.  Invalid input(s): FREET3 Anemia work up No results for input(s): VITAMINB12, FOLATE, FERRITIN, TIBC, IRON, RETICCTPCT in the last 72 hours. Urinalysis    Component Value Date/Time   COLORURINE STRAW (A) 06/19/2020 0835   APPEARANCEUR CLEAR 06/19/2020 0835   LABSPEC 1.009 06/19/2020 0835   PHURINE 7.0 06/19/2020 0835   GLUCOSEU NEGATIVE 06/19/2020 0835   HGBUR NEGATIVE 06/19/2020 0835   BILIRUBINUR NEGATIVE 06/19/2020 0835   BILIRUBINUR 2+ 04/29/2019 1421   KETONESUR NEGATIVE 06/19/2020 0835   PROTEINUR NEGATIVE 06/19/2020 0835   UROBILINOGEN 2.0 (A) 04/29/2019 1421   NITRITE NEGATIVE 06/19/2020 0835   LEUKOCYTESUR NEGATIVE 06/19/2020 0835   Sepsis Labs Invalid input(s): PROCALCITONIN,  WBC,  LACTICIDVEN Microbiology Recent Results (from the past 240 hour(s))  SARS Coronavirus 2 by RT PCR (hospital order, performed in [place] [facility] hospital lab) Nasopharyngeal Nasopharyngeal Swab     Status: None   Collection Time: 06/19/20  8:51 AM   Specimen: Nasopharyngeal  Swab  Result Value Ref Range Status   SARS Coronavirus 2 NEGATIVE NEGATIVE Final    Comment: (NOTE) SARS-CoV-2 target nucleic acids are NOT DETECTED.  The SARS-CoV-2 RNA is generally detectable in upper and lower respiratory specimens during the acute phase of infection. The lowest concentration of SARS-CoV-2 viral copies this assay can detect is 250 copies / mL. A negative result does not preclude SARS-CoV-2 infection and should not be used as the sole basis for treatment or other patient management decisions.  A negative result may occur with improper specimen collection / handling, submission of specimen other than nasopharyngeal swab, presence of viral mutation(s) within the areas targeted by this assay, and inadequate number of viral copies (<250 copies / mL). A negative result must be combined with clinical observations, patient history, and epidemiological information.  Fact Sheet for Patients:   StrictlyIdeas.no  Fact Sheet for Healthcare Providers: BankingDealers.co.za  This test is not yet approved or  cleared by the [place] FDA and has been authorized for detection and/or diagnosis of SARS-CoV-2 by FDA under an Emergency Use Authorization (EUA).  This EUA will remain in effect (meaning this test can be used) for the duration of the COVID-19 declaration under Section 564(b)(1) of the Act, 21 U.S.C. section 360bbb-3(b)(1), unless the authorization is terminated or revoked sooner.  Performed at [facility], [place] [address], [place], [place] [address]     Please note: You were cared for by a hospitalist during your hospital stay. Once you are discharged, your primary care physician will handle any further medical issues. Please note that NO REFILLS for any discharge medications will be authorized once you are discharged, as it is imperative that you return to your primary care physician (or establish a relationship  with a primary care physician if you do not have one) for your post hospital discharge needs so that they can reassess your need for medications and monitor your lab values.    Time coordinating discharge: 40 minutes  SIGNED:   Shelly Coss, MD  [facility] 06/20/2020, 1:11 PM Pager [phone]  If 7PM-7AM, please contact night-coverage [email or web address] Password [email or web address]

## 2020-06-20 NOTE — TOC Transition Note (Signed)
Transition of Care [facility]) - CM/SW Discharge Note   Patient Details  Name: Allison Roberson MRN: [id number] Date of Birth: 10/23/1941  [facility] CM/SW Contact:  Pollie Friar, RN Phone Number: 06/20/2020, 1:40 PM   Clinical Narrative:    Pt is discharging home with self care. No f/u per PT.  Pt has supervision at home and transportation to home.   Final next level of care: Home/Self Care Barriers to Discharge: No Barriers Identified   Patient Goals and CMS Choice        Discharge Placement                       Discharge Plan and Services   Discharge Planning Services: CM Consult                                 Social Determinants of Health (SDOH) Interventions     Readmission Risk Interventions No flowsheet data found.

## 2020-06-20 NOTE — Progress Notes (Signed)
NEUROLOGY PROGRESS NOTE  Subjective: No complaints at this time  Exam: Vitals:   06/20/20 0847 06/20/20 0910  BP: (!) 99/56 (!) 104/54  Pulse: 72 78  Resp: (!) 21 19  Temp: 98.8 F (37.1 C) 98.8 F (37.1 C)  SpO2: 94% 96%    Physical Exam  Constitutional: Appears well-developed and well-nourished.  Psych: Affect appropriate to situation Eyes: No scleral injection HENT: No OP obstrucion Head: Normocephalic.  Cardiovascular: Palpable Respiratory: Effort normal, non-labored breathing GI: Soft.  No distension. There is no tenderness.  Skin: WDI   Neuro:  Mental Status: Alert, oriented, thought content appropriate.  Speech fluent without evidence of aphasia.  Able to follow 3 step commands without difficulty. Cranial Nerves: II:  Visual fields grossly normal,  III,IV, VI: ptosis not present, extra-ocular motions intact bilaterally pupils equal, round, reactive to light and accommodation V,VII: smile symmetric, facial light touch sensation normal bilaterally VIII: hearing normal bilaterally IX,X: Palate rises midline XI: bilateral shoulder shrug XII: midline tongue extension Motor: Right : Upper extremity   5/5    Left:     Upper extremity   5/5  Lower extremity   5/5     Lower extremity   5/5 Tone and bulk:normal tone throughout; no atrophy noted Sensory: Pinprick and light touch intact throughout, bilaterally Deep Tendon Reflexes: 2+ and symmetric throughout Plantars: Right: downgoing   Left: downgoing     Medications:  Scheduled: . aspirin  81 mg Oral Daily  . enoxaparin (LOVENOX) injection  40 mg Subcutaneous Q24H  . folic acid  732 mcg Oral Daily  . levETIRAcetam  500 mg Oral BID  . levothyroxine  50 mcg Oral Daily  . omega-3 acid ethyl esters  1 g Oral Daily  . vitamin B-12  1,000 mcg Oral Daily  . vitamin E  400 Units Oral Daily   Continuous:  KGU:[id number] **OR** acetaminophen, ondansetron **OR** ondansetron (ZOFRAN) IV  Pertinent  Labs/Diagnostics:   EEG  Result Date: 06/19/2020 .   IMPRESSION: This is a normal EEG for age. CLINICAL CORRELATION: This EEG finding did not support the diagnosis of seizure.  Clinical correlation is advised. A normal EEG does not rule out epilepsy, which is a clinical diagnosis. I have personally reviewed this study.  MR BRAIN W WO CONTRAST  Result Date: 06/19/2020  IMPRESSION: Motion degraded examination as described. This includes moderate to moderately severe motion degradation of the T1 weighted postcontrast imaging. Within this limitation, there is no evidence of acute intracranial abnormality. No specific seizure focus is identified. 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 Background mild chronic small vessel ischemic disease. Mild ethmoid sinus mucosal thickening. Electronically Signed   By: Kellie Simmering DO   On: 06/19/2020 17:35    CT HEAD CODE STROKE WO CONTRAST  Result Date: 06/19/2020  IMPRESSION: 1. No acute cortically based infarct or acute intracranial hemorrhage identified. ASPECTS 10. 2. Chronic appearing right cerebellar infarct. Mild to moderate for age white matter changes. 3. These results were communicated to Dr. Cheral Marker at 7:27 am on 06/19/2020 by text page via the [facility] messaging system. Electronically Signed   By: Genevie Ann M.D.   On: 06/19/2020 07:28     Etta Quill PA-C [facility] [phone]  Assessment:  There is a 79 year old female presenting with altered mental status after husband woke up hearing abnormal breathing and noting the patient was unresponsive. 1. patient had a similar spell in June 2020.  EEG at follow-up neurology visit revealed  intermittent left temporal sharply-contoured slowing but no clear epileptiform discharges. 2.  Prior MRI of brain showed chronic cerebellar infarcts.  MRI obtained on this hospital visit showed no evidence of acute intracranial abnormality.  No specific seizure focus was  identified. 3.  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  Patient was started on Keppra 500 mg twice daily.  At present time she is back to her baseline.  She denies any side effects from medication. 4.  As above EEG was normal for age.   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 Use caution when using heavy equipment or power tools. Avoid working on ladders or at heights. Take showers instead of baths. Ensure the water temperature is not too high on the home water heater. Do not go swimming alone. When caring for infants or small children, sit down when holding, feeding, or changing them to minimize risk of injury to the child in the event you have a seizure.   Also, Maintain good sleep hygiene. Avoid alcohol.     06/20/2020, 10:19 AM

## 2020-06-21 ENCOUNTER — Telehealth: Payer: Self-pay | Admitting: *Deleted

## 2020-06-21 NOTE — Telephone Encounter (Signed)
Transition Care Management Follow-up Telephone Call   Date discharged? 06/20/2020   How have you been since you were released from the hospital? "I'm fine"    Do you understand why you were in the hospital? yes   Do you understand the discharge instructions? yes   Where were you discharged to? Home    Items Reviewed:  Medications reviewed: yes  Allergies reviewed: NKA   Dietary changes reviewed: N/A   Referrals reviewed: yes, Neurology    Functional Questionnaire:   Activities of Daily Living (ADLs):   She states they are independent in the following: ambulation, bathing and hygiene, feeding, continence, grooming, toileting and dressing States they require assistance with the following: N/A    Any transportation issues/concerns?: no   Any patient concerns? no   Confirmed importance and date/time of follow-up visits scheduled yes  Provider Appointment booked with Dr. Martinique Friday 07/01/2020 at 2PM   Confirmed with patient if condition begins to worsen call PCP or go to the ER.  Patient was given the office number and encouraged to call back with question or concerns.  : yes

## 2020-07-01 ENCOUNTER — Ambulatory Visit (INDEPENDENT_AMBULATORY_CARE_PROVIDER_SITE_OTHER): Payer: Medicare Other | Admitting: Family Medicine

## 2020-07-01 ENCOUNTER — Encounter: Payer: Self-pay | Admitting: Family Medicine

## 2020-07-01 ENCOUNTER — Telehealth: Payer: Self-pay | Admitting: Family Medicine

## 2020-07-01 ENCOUNTER — Inpatient Hospital Stay: Payer: Self-pay | Admitting: Family Medicine

## 2020-07-01 VITALS — Ht 59.0 in

## 2020-07-01 DIAGNOSIS — E782 Mixed hyperlipidemia: Secondary | ICD-10-CM | POA: Diagnosis not present

## 2020-07-01 DIAGNOSIS — I6930 Unspecified sequelae of cerebral infarction: Secondary | ICD-10-CM | POA: Diagnosis not present

## 2020-07-01 DIAGNOSIS — R404 Transient alteration of awareness: Secondary | ICD-10-CM

## 2020-07-01 MED ORDER — PRAVASTATIN SODIUM 20 MG PO TABS: 10.0000 mg | ORAL_TABLET | Freq: Every day | ORAL | 1 refills | Status: DC

## 2020-07-01 NOTE — Telephone Encounter (Signed)
Can we do a virtual video visit today?   Thanks, BJ

## 2020-07-01 NOTE — Progress Notes (Deleted)
NO SHOW [name] is a 79 y.o. female, who is here today to follow on recent OV/ER.   She was admitted from 06/19/2020 to 06/20/2020. She presented today ER on date of admission with altered mental status.  Her unresponsive, not breathing. Husband started CPR. He called 911. Upon ER evaluation she was found to be hemodynamically stable. No focal neurologic deficit. Suspected seizure episode. EEG negative for any clear seizure activity. She was started on Keppra 500 mg twice daily.  Lab Results  Component Value Date   CREATININE 0.80 06/19/2020   BUN 16 06/19/2020   NA 141 06/19/2020   K 4.0 06/19/2020   CL 103 06/19/2020   CO2 21 (L) 06/19/2020    According to records, she has similar episode in 2016. Head CT:*** Brain MRI:***  Review of Systems Rest see pertinent positives and negatives per HPI.   Current Outpatient Medications on File Prior to Visit  Medication Sig Dispense Refill  . Ascorbic Acid (VITAMIN C) 1000 MG tablet Take 1,000 mg by mouth daily.     [name] aspirin 81 MG chewable tablet Chew 81 mg by mouth daily.     . Cholecalciferol (VITAMIN D3) 400 units CAPS Take 400 Units by mouth daily.     . Cyanocobalamin (VITAMIN B 12 PO) Take 1,000 mg by mouth daily.    . folic acid (FOLVITE) 474 MCG tablet Take 400 mcg by mouth daily.     . Garlic 2595 MG CAPS Take 1,000 mg by mouth daily.     . Glucosamine-Chondroitin (GLUCOSAMINE CHONDR COMPLEX PO) Take 1 tablet by mouth at bedtime.    . levETIRAcetam (KEPPRA) 500 MG tablet Take 1 tablet (500 mg total) by mouth 2 (two) times daily. 60 tablet 1  . levothyroxine (SYNTHROID) 50 MCG tablet TAKE 1 TABLET(50 MCG) BY MOUTH DAILY (Patient taking differently: Take 50 mcg by mouth daily. ) 90 tablet 3  . Multiple Vitamins-Minerals (CENTRUM SILVER) tablet Take 1 tablet by mouth at bedtime.     . Omega-3 Fatty Acids (FISH OIL) 1000 MG CAPS Take 1,000 mg by mouth daily.    . vitamin E 400 UNIT capsule Take 400 Units  by mouth daily.      No current facility-administered medications on file prior to visit.     Past Medical History:  Diagnosis Date  . Allergy   . Arthritis    hands  . Blood transfusion without reported diagnosis 1963   with child birth  . Cancer [facility])    uterine cancer - age 66   . Cataract    small, watching   . GERD (gastroesophageal reflux disease)   . Heart murmur    with pregnancy- none noted after pregnancies   . Hyperlipidemia   . Osteopenia    gets prolia every 6 months   . Stroke [facility])    TIA x 3 per pt- 05-2019 last episode - per note in care every where/ neuro note not CVA but old infarts noted on testing   . Thyroid disease    No Known Allergies  Social History   Socioeconomic History  . Marital status: Married    Spouse name: Not on file  . Number of children: 2  . Years of education: Not on file  . Highest education level: High school graduate  Occupational History  . Not on file  Tobacco Use  . Smoking status: Never Smoker  . Smokeless tobacco: Never Used  Vaping Use  .  Vaping Use: Never used  Substance and Sexual Activity  . Alcohol use: No  . Drug use: No  . Sexual activity: Never  Other Topics Concern  . Not on file  Social History Narrative   Social Review      Patient lives at home with.SPOUSE      Pt lives in a one or two story home? 2 STORY HOME      Does patient lives in a facility? If so where? PRIVATE HOME      What is patient highest level of education? HIGH SCHOOL GRADUATE      Is patient RIGHT or LEFT handed? RIGHT HANDED      Does patient drink coffee, tea, soda? YES      How much coffee, tea, soda does patient drink? SODA SOMETIMES, COFFEE ONCE A WEEK      Does patient exercise regularly? YES SHE HAS EXERCISE BIKE IN HER HOME   Social Determinants of Health   Financial Resource Strain:   . Difficulty of Paying Living Expenses:   Food Insecurity:   . Worried About Charity fundraiser in the Last Year:   . Youth worker in the Last Year:   Transportation Needs:   . Film/video editor (Medical):   [name] Lack of Transportation (Non-Medical):   Physical Activity:   . Days of Exercise per Week:   . Minutes of Exercise per Session:   Stress:   . Feeling of Stress :   Social Connections:   . Frequency of Communication with Friends and Family:   . Frequency of Social Gatherings with Friends and Family:   . Attends Religious Services:   . Active Member of Clubs or Organizations:   . Attends Archivist Meetings:   [name] Marital Status:     There were no vitals filed for this visit. There is no height or weight on file to calculate BMI.      Physical Exam  ASSESSMENT AND PLAN:  There are no diagnoses linked to this encounter.   No orders of the defined types were placed in this encounter.   There are no diagnoses linked to this encounter.  No problem-specific Assessment & Plan notes found for this encounter.      Reinaldo Helt G. Martinique, MD  [facility]. [place] office.

## 2020-07-01 NOTE — Assessment & Plan Note (Signed)
Problem is not well controlled. LDL above goal. After discussion of some benefits of statin medication as well as side effects, she agrees with trying pravastatin. Pravastatin 10 mg daily started today. Continue low-fat diet. We will recheck lipid panel in 2 to 3 months.

## 2020-07-01 NOTE — Assessment & Plan Note (Signed)
Suspected seizure disorder. Continue Keppra 500 mg twice daily. She has an appointment with neurology next visit. Instructed not to drive for at least 6 months unless neurology recommends otherwise.

## 2020-07-01 NOTE — Telephone Encounter (Signed)
Appt made for today at 4:30.

## 2020-07-01 NOTE — Progress Notes (Signed)
Virtual Visit via Telephone Note I connected with Allison Roberson on 7/16/21at  4:30 PM EDT by telephone and verified that I am speaking with the correct person using two identifiers.   I discussed the limitations, risks, security and privacy concerns of performing an evaluation and management service by telephone and the availability of in person appointments. I also discussed with the patient that there may be a patient responsible charge related to this service. She expressed understanding and agreed to proceed.  Location patient: home Location provider: work office Participants present for the call: patient, provider Patient did not have a visit in the prior 7 days to address this/these issue(s).  Chief Complaint  Patient presents with  . Hospitalization Follow-up   History of Present Illness: Allison Roberson is a 79 yo female with hx of HLD,elevated BP,and hypothyroidism following on recent hospitalization.  She missed her appt earlier today. States that she and her husband were on their way here to the office,husband got lost, it was raining hard, so they decided to go back home.  She was admitted from 06/19/2020 to 06/20/2020. She presented today ER on date of admission with altered mental status.  Her husband found her unresponsive, not breathing; so he started started CPR. He called 911. Upon ER evaluation she was found to be hemodynamically stable. No focal neurologic deficit. Suspected seizure episode. EEG negative for any clear seizure activity. She was started on Keppra 500 mg twice daily. She has not had any other episodes since hospital discharge.  In general she has tolerated Keppra well, except for weakness-like sensation. She is not driving.  Negative for fever,chils,visual changes,sore throat,CP,SOB,palpitations,abdominal pain,N/V,or dizziness. Negative for urinary symptoms or changes in bowel habits. She does not need assistance with ADL's.   06/19/20 head CT:  1. No  acute cortically based infarct or acute intracranial hemorrhage identified. ASPECTS 10. 2. Chronic appearing right cerebellar infarct. Mild to moderate for age white matter changes.  06/19/20 Brain BHA:LPFXTK degraded examination as described. This includes moderate to moderately severe motion degradation of the T1 weighted post contrast imaging. Within this limitation, there is no evidence of acute intracranial abnormality. No specific seizure focus is identified. 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 Background mild chronic small vessel ischemic disease. Mild ethmoid sinus mucosal thickening.  Lab Results  Component Value Date   CREATININE 0.80 06/19/2020   BUN 16 06/19/2020   NA 141 06/19/2020   K 4.0 06/19/2020   CL 103 06/19/2020   CO2 21 (L) 06/19/2020   Lab Results  Component Value Date   ALT 18 06/19/2020   AST 33 06/19/2020   ALKPHOS 112 06/19/2020   BILITOT 0.8 06/19/2020   Hyperlipidemia: She has not tolerated statins in the past, simvastatin. She is on low-fat diet.  Lab Results  Component Value Date   CHOL 272 (H) 12/09/2019   HDL 56.30 12/09/2019   LDLDIRECT 166.0 12/09/2019   TRIG 294.0 (H) 12/09/2019   CHOLHDL 5 12/09/2019   Observations/Objective: Patient sounds cheerful and well on the phone. I do not appreciate any SOB. Speech and thought processing are grossly intact. Patient reported vitals:Ht 4\' 11"  (1.499 m)   BMI 23.42 kg/m   Assessment and Plan:  AMS (altered mental status) Suspected seizure disorder. Continue Keppra 500 mg twice daily. She has an appointment with neurology next visit. Instructed not to drive for at least 6 months unless neurology recommends otherwise.  Chronic cerebrovascular accident (CVA) Pravastatin 10 mg added today.  Continue Aspirin 81 mg daily. Adequate BP control.  Mixed hyperlipidemia Problem is not well controlled. LDL above goal. After discussion of some benefits of  statin medication as well as side effects, she agrees with trying pravastatin. Pravastatin 10 mg daily started today. Continue low-fat diet. We will recheck lipid panel in 2 to 3 months.  Follow Up Instructions:  Return in about 2 months (around 09/08/2020) for HLD/fasting labs.  I did not refer this patient for an OV in the next 24 hours for this/these issue(s).  I discussed the assessment and treatment plan with the patient. Allison Roberson was provided an opportunity to ask questions and all were answered. The patient agreed with the plan and demonstrated an understanding of the instructions.   The patient was advised to call back or seek an in-person evaluation if the symptoms worsen or if the condition fails to improve as anticipated.  I provided 15 minutes of non-face-to-face time during this encounter.   Earlyn Sylvan Martinique, MD

## 2020-07-01 NOTE — Telephone Encounter (Signed)
Tried calling the pt on both numbers and also the spouse number was not able to get in touch with the pt.

## 2020-07-01 NOTE — Assessment & Plan Note (Signed)
Pravastatin 10 mg added today. Continue Aspirin 81 mg daily. Adequate BP control.

## 2020-07-01 NOTE — Telephone Encounter (Signed)
Pt was calling in stating that they got turned around due to the heavy rain and would have gotten her today after 2:15 and the appt was at 2:00 for a hospital follow-up.  Roselyn Reef suggest that I ask if you would you like for the pt to be rescheduled when you return back in the office with you or with another provider?

## 2020-07-11 NOTE — Progress Notes (Signed)
NEUROLOGY FOLLOW UP OFFICE NOTE  Allison Roberson [id number]  HISTORY OF PRESENT ILLNESS: Allison Roberson is a 79 year old right-handed white female with hyperlipidemia, TMJ dysfunction, thyroid disease and remote history of uterine cancer who follows up for seizure.  UPDATE: Last seen for initial visit in October 2020.  Routine EEG on 10/19/2019 showed intermittent left temporal sharply-contoured slowing but no clear epileptiform discharges.  A 24 hour ambulatory EEG from 10/28/2019 to 10/29/2019 was normal.   She was admitted to [facility] on 06/19/2020 after she was found down by her husband unresponsive and not breathing.  He started CPR and 911 was called.  She was awake once EMS arrived.  MRI of brain was limited due to motion artifact but chronic small vessel ischemic changes noted and no acute abnormality.  EEG was unremarkable.  Seizure was suspected and she was started on Keppra 500mg  twice daily but has only been taking 250mg  twice daily.  No recurrent spells.    HISTORY: On 05/20/2019, she was sleeping in bed when her husband heard change in breathing.  He was unable to wake her up and he couldn't.  No associated shaking, incontinence or tongue biting.  EMS was contacted and she was brought to [facility].  In the ED, she appeared confused and did not remember the episode.  CBC, electrolytes, troponin and EKG were unremarkable.  She was admitted for further evaluation.  She had an apnea link performed which did not show any episodes of apnea but her O2 saturation dropped below 80% several times.  CT chest showed questionable atelectasis versus infiltrate in upper lobes.  She did not exhibit any fever or upper respiratory symptoms but was started on empiric IV antibiotics and Lasix.  She demonstrated no further nocturnal hypoxia.  CT of head was negative for acute intracranial abnormality.  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  CTA of  head and neck demonstrated fibromuscular dysplasia involving the carotid arteries without occlusion or significant stenosis.  No definitive diagnosis was established.  She was discharged with home oxygen and recommendation for outpatient sleep study and neurology follow up.    PAST MEDICAL HISTORY: Past Medical History:  Diagnosis Date  . Allergy   . Arthritis    hands  . Blood transfusion without reported diagnosis 1963   with child birth  . Cancer [facility])    uterine cancer - age 89   . Cataract    small, watching   . GERD (gastroesophageal reflux disease)   . Heart murmur    with pregnancy- none noted after pregnancies   . Hyperlipidemia   . Osteopenia    gets prolia every 6 months   . Stroke [facility])    TIA x 3 per pt- 05-2019 last episode - per note in care every where/ neuro note not CVA but old infarts noted on testing   . Thyroid disease     MEDICATIONS: Current Outpatient Medications on File Prior to Visit  Medication Sig Dispense Refill  . Ascorbic Acid (VITAMIN C) 1000 MG tablet Take 1,000 mg by mouth daily.     [name] aspirin 81 MG chewable tablet Chew 81 mg by mouth daily.     . Cholecalciferol (VITAMIN D3) 400 units CAPS Take 400 Units by mouth daily.     . Cyanocobalamin (VITAMIN B 12 PO) Take 1,000 mg by mouth daily.    . folic acid (FOLVITE) 403 MCG tablet Take 400 mcg by  mouth daily.     . Garlic 4562 MG CAPS Take 1,000 mg by mouth daily.     . Glucosamine-Chondroitin (GLUCOSAMINE CHONDR COMPLEX PO) Take 1 tablet by mouth at bedtime.    . levETIRAcetam (KEPPRA) 500 MG tablet Take 1 tablet (500 mg total) by mouth 2 (two) times daily. 60 tablet 1  . levothyroxine (SYNTHROID) 50 MCG tablet TAKE 1 TABLET(50 MCG) BY MOUTH DAILY (Patient taking differently: Take 50 mcg by mouth daily. ) 90 tablet 3  . Multiple Vitamins-Minerals (CENTRUM SILVER) tablet Take 1 tablet by mouth at bedtime.     . Omega-3 Fatty Acids (FISH OIL) 1000 MG CAPS Take 1,000 mg by mouth daily.    .  pravastatin (PRAVACHOL) 20 MG tablet Take 0.5 tablets (10 mg total) by mouth daily. 45 tablet 1  . vitamin E 400 UNIT capsule Take 400 Units by mouth daily.      No current facility-administered medications on file prior to visit.    ALLERGIES: No Known Allergies  FAMILY HISTORY: Family History  Problem Relation Age of Onset  . Colon polyps Son   . Colon cancer Neg Hx   . Rectal cancer Neg Hx   . Stomach cancer Neg Hx   . Pancreatic cancer Neg Hx   . Esophageal cancer Neg Hx     SOCIAL HISTORY: Social History   Socioeconomic History  . Marital status: Married    Spouse name: Not on file  . Number of children: 2  . Years of education: Not on file  . Highest education level: High school graduate  Occupational History  . Not on file  Tobacco Use  . Smoking status: Never Smoker  . Smokeless tobacco: Never Used  Vaping Use  . Vaping Use: Never used  Substance and Sexual Activity  . Alcohol use: No  . Drug use: No  . Sexual activity: Never  Other Topics Concern  . Not on file  Social History Narrative   Social Review      Patient lives at home with.SPOUSE      Pt lives in a one or two story home? 2 STORY HOME      Does patient lives in a facility? If so where? PRIVATE HOME      What is patient highest level of education? HIGH SCHOOL GRADUATE      Is patient RIGHT or LEFT handed? RIGHT HANDED      Does patient drink coffee, tea, soda? YES      How much coffee, tea, soda does patient drink? SODA SOMETIMES, COFFEE ONCE A WEEK      Does patient exercise regularly? YES SHE HAS EXERCISE BIKE IN HER HOME   Social Determinants of Health   Financial Resource Strain:   . Difficulty of Paying Living Expenses:   Food Insecurity:   . Worried About Charity fundraiser in the Last Year:   . Arboriculturist in the Last Year:   Transportation Needs:   . Film/video editor (Medical):   [name] Lack of Transportation (Non-Medical):   Physical Activity:   . Days of  Exercise per Week:   . Minutes of Exercise per Session:   Stress:   . Feeling of Stress :   Social Connections:   . Frequency of Communication with Friends and Family:   . Frequency of Social Gatherings with Friends and Family:   . Attends Religious Services:   . Active Member of Clubs or Organizations:   . Attends  Club or Organization Meetings:   [name] Marital Status:   Intimate Partner Violence:   . Fear of Current or Ex-Partner:   . Emotionally Abused:   [name] Physically Abused:   . Sexually Abused:     PHYSICAL EXAM: Blood pressure 122/70, pulse 90, height 4\' 11"  (1.499 m), weight 116 lb (52.6 kg), SpO2 96 %. General: No acute distress.  Patient appears well-groomed.   Head:  Normocephalic/atraumatic Eyes:  Fundi examined but not visualized Neck: supple, no paraspinal tenderness, full range of motion Heart:  Regular rate and rhythm Lungs:  Clear to auscultation bilaterally Back: No paraspinal tenderness Neurological Exam: alert and oriented to person, place, and time. Attention span and concentration intact, recent and remote memory intact, fund of knowledge intact.  Speech fluent and not dysarthric, language intact.  CN II-XII intact. Bulk and tone normal, muscle strength 5/5 throughout.  Sensation to light touch, temperature and vibration intact.  Deep tendon reflexes 2+ throughout, toes downgoing.  Finger to nose and heel to shin testing intact.  Gait normal, Romberg negative.  IMPRESSION: Probable seizure, second unprovoked spell indicates seizure disorder  PLAN: 1.  Keppra 500mg  twice daily 2.  No driving for 6 months as per [place] law 3.  Follow up in 6 months  Metta Clines, DO  CC:  Betty Martinique, MD

## 2020-07-12 ENCOUNTER — Other Ambulatory Visit: Payer: Self-pay

## 2020-07-12 ENCOUNTER — Encounter: Payer: Self-pay | Admitting: Neurology

## 2020-07-12 ENCOUNTER — Ambulatory Visit: Payer: Medicare Other | Admitting: Neurology

## 2020-07-12 ENCOUNTER — Telehealth: Payer: Self-pay | Admitting: Neurology

## 2020-07-12 VITALS — BP 122/70 | HR 90 | Ht 59.0 in | Wt 116.0 lb

## 2020-07-12 DIAGNOSIS — R569 Unspecified convulsions: Secondary | ICD-10-CM

## 2020-07-12 MED ORDER — LEVETIRACETAM 500 MG PO TABS: 500.0000 mg | ORAL_TABLET | Freq: Two times a day (BID) | ORAL | 5 refills | Status: DC

## 2020-07-12 NOTE — Telephone Encounter (Signed)
I have instructed her to take the [place] as prescribed:  500mg  twice daily.

## 2020-07-12 NOTE — Telephone Encounter (Signed)
Patient just left from follow up visit. As she was checking out patients husband asked for a message to be sent back to let Dr. Tomi Roberson know that the patient hasn't been taking her medication as she should and has not been doing well. He states she has a glazed look like she's about to go into a seizure.

## 2020-07-12 NOTE — Patient Instructions (Signed)
1. Take levetiracetam 500mg  twice daily.  Do not stop taking the medicine without talking to your doctor first, even if you have not had a seizure in a long time.   2. Avoid activities in which a seizure would cause danger to yourself or to others.  Don't operate dangerous machinery, swim alone, or climb in high or dangerous places, such as on ladders, roofs, or girders.  Do not drive unless your doctor says you may.  3. If you have any warning that you may have a seizure, lay down in a safe place where you can't hurt yourself.    4.  No driving for 6 months from last seizure, as per [facility].   Please refer to the following link on the [place] website for more information: http://www.epilepsyfoundation.org/answerplace/Social/driving/drivingu.cfm   5.  Maintain good sleep hygiene.  6.  Notify your neurology if you are planning pregnancy or if you become pregnant.  7.  Contact your doctor if you have any problems that may be related to the medicine you are taking.  8.  Call 911 and bring the patient back to the ED if:        A.  The seizure lasts longer than 5 minutes.       B.  The patient doesn't awaken shortly after the seizure  C.  The patient has new problems such as difficulty seeing, speaking or moving  D.  The patient was injured during the seizure  E.  The patient has a temperature over 102 F (39C)  F.  The patient vomited and now is having trouble breathing

## 2020-08-31 ENCOUNTER — Telehealth: Payer: Self-pay | Admitting: Neurology

## 2020-08-31 ENCOUNTER — Other Ambulatory Visit: Payer: Self-pay | Admitting: Family Medicine

## 2020-08-31 ENCOUNTER — Other Ambulatory Visit: Payer: Self-pay | Admitting: Neurology

## 2020-08-31 DIAGNOSIS — Z1231 Encounter for screening mammogram for malignant neoplasm of breast: Secondary | ICD-10-CM

## 2020-08-31 MED ORDER — LACOSAMIDE 50 MG PO TABS: 50.0000 mg | ORAL_TABLET | Freq: Two times a day (BID) | ORAL | 5 refills | Status: DC

## 2020-08-31 NOTE — Telephone Encounter (Signed)
Any suggestions on this?

## 2020-08-31 NOTE — Telephone Encounter (Signed)
Patient called in stating she is having some trouble with her levetiracetam. She is having difficulty swallowing, hoarseness, itching, muscle pain, weakness legs and hands, her back hurts, coughing, nosebleeds, lower appetite, cramps in legs and fingers, and she is going to the bathroom more often.

## 2020-08-31 NOTE — Telephone Encounter (Signed)
I sent a different medication called lacosamide to the [facility] in [place].  1 pill twice a day.  She may discontinue levetiracetam.

## 2020-08-31 NOTE — Telephone Encounter (Signed)
Mail box full at 158pm.

## 2020-09-01 ENCOUNTER — Telehealth: Payer: Self-pay | Admitting: Family Medicine

## 2020-09-01 ENCOUNTER — Other Ambulatory Visit: Payer: Self-pay | Admitting: Family Medicine

## 2020-09-01 ENCOUNTER — Encounter: Payer: Self-pay | Admitting: Family Medicine

## 2020-09-01 ENCOUNTER — Telehealth (INDEPENDENT_AMBULATORY_CARE_PROVIDER_SITE_OTHER): Payer: Medicare Other | Admitting: Family Medicine

## 2020-09-01 ENCOUNTER — Other Ambulatory Visit: Payer: Self-pay

## 2020-09-01 DIAGNOSIS — E782 Mixed hyperlipidemia: Secondary | ICD-10-CM | POA: Diagnosis not present

## 2020-09-01 DIAGNOSIS — T50905A Adverse effect of unspecified drugs, medicaments and biological substances, initial encounter: Secondary | ICD-10-CM | POA: Diagnosis not present

## 2020-09-01 DIAGNOSIS — M791 Myalgia, unspecified site: Secondary | ICD-10-CM | POA: Diagnosis not present

## 2020-09-01 DIAGNOSIS — R21 Rash and other nonspecific skin eruption: Secondary | ICD-10-CM | POA: Diagnosis not present

## 2020-09-01 MED ORDER — ROSUVASTATIN CALCIUM 5 MG PO TABS
5.0000 mg | ORAL_TABLET | Freq: Every day | ORAL | 0 refills | Status: DC
Start: 2020-09-01 — End: 2020-09-28

## 2020-09-01 NOTE — Telephone Encounter (Signed)
Pt stated the pravastatin is causing her to have nose bleeds, swallowing, hoarse voice, itching, muscle pain, cramps in legs/fingers. She would like to go back to her other medication with a smaller dose?   Due to symptoms I transferred pot to Nurse Triage   Pharmacy: [facility], [address] [place] Phone:  [phone]  Fax:  [phone]

## 2020-09-01 NOTE — Progress Notes (Addendum)
Virtual Visit via Telephone Note  I connected with Allison Roberson on 09/01/20 at  1:20 PM EDT by telephone and verified that I am speaking with the correct person using two identifiers.   I discussed the limitations, risks, security and privacy concerns of performing an evaluation and management service by telephone and the availability of in person appointments. I also discussed with the patient that there may be a patient responsible charge related to this service. The patient expressed understanding and agreed to proceed.  Location patient: home, [place] Location provider: work or home office Participants present for the call: patient, provider Patient did not have a visit in the prior 7 days to address this/these issue(s).   History of Present Illness:  Acute visit for several issues/concerns for medication side effects:  -started after starting pravastatin about 2 months ago -she was hoping symptoms would go away, but the did not -symptoms include myalgias, itchy skin on back, drainage in throat and hoarseness at times -reports had issues with simvastatin in the past, but not as bad as she is having with the pravastatin and wants to change to a lower dose or different statin -per chart hx sig for CVD, Hyperlipidemia, hypothyroid, dysphagia, OA  Observations/Objective: Patient sounds cheerful and well on the phone. I do not appreciate any SOB. Speech and thought processing are grossly intact. Patient reported vitals:  Assessment and Plan:  Mixed hyperlipidemia  Adverse effect of drug, initial encounter  Skin rash  Muscle soreness  -we discussed possible serious and likely etiologies, options for evaluation and workup, limitations of telemedicine visit vs in person visit, treatment, treatment risks and precautions. Pt prefers to treat via telemedicine empirically rather then risking or undertaking an in person visit at this moment. She wants to continue a statin, but may be  having some side effects. These have been chronic. Denies hives, SOB, swelling of the face or mouth, GI symptoms. Discussed options. She has decided to stop the pravastatin and try a very low dose of crestor 5mg  every other day with follow up with PCP. She agrees to stop medication and seek prompt care if any issues with this. Sent message to PCP office to assist with scheduling follow up visit. Discussed other possible causes of symptoms, precautions. Advised to seek prompt follow up telemedicine visit or in person care sooner  if worsening, new symptoms arise, or if is not improving with treatment.   Follow Up Instructions:   I did not refer this patient for an OV in the next 24 hours for this/these issue(s).  I discussed the assessment and treatment plan with the patient. The patient was provided an opportunity to ask questions and all were answered. The patient agreed with the plan and demonstrated an understanding of the instructions.   The patient was advised to call back or seek an in-person evaluation if the symptoms worsen or if the condition fails to improve as anticipated.  I provided > 22 minutes of non-face-to-face time during this encounter.   Lucretia Kern, DO

## 2020-09-01 NOTE — Telephone Encounter (Signed)
Pt called and advised 

## 2020-09-05 NOTE — Telephone Encounter (Signed)
I do not see Pravastatin on her medication list. I see Crestor 5 mg (?).  If she thinks these symptoms are caused by statin, she can split tab. Thanks, BJ

## 2020-09-05 NOTE — Telephone Encounter (Signed)
Patient was seen by Dr. Maudie Mercury on 9/16 and trial of Crestor started. Pt will f/u with pcp in 4 weeks.

## 2020-09-05 NOTE — Progress Notes (Signed)
Patient is scheduled for a telephone visit with Dr. Martinique on 10/03/2020 at 2:30

## 2020-09-08 ENCOUNTER — Telehealth: Payer: Self-pay | Admitting: Neurology

## 2020-09-08 NOTE — Telephone Encounter (Signed)
Patient called in to say that she is currently taking Vimpat and she is having some itching, coughing, hoarseness, fast heartbeat, numbness around her mouth, trouble swallowing, and hot flashes.

## 2020-09-08 NOTE — Telephone Encounter (Signed)
I contacted patient and patient has already stopped the medication, these sides happened two days and is fine today. Just reporting symptoms.FYI

## 2020-09-13 ENCOUNTER — Telehealth: Payer: Self-pay | Admitting: Neurology

## 2020-09-13 NOTE — Telephone Encounter (Signed)
Patient's daughter wants to know if they can request the patient to be moved to a local hospital in [place] so Dr. Tomi Likens can check on her. She is currently at [facility] in [place]. Patient reportedly does not remember anything.

## 2020-09-13 NOTE — Telephone Encounter (Signed)
No answer at 1104  

## 2020-09-13 NOTE — Telephone Encounter (Signed)
Patient's daughter called to let Dr Tomi Likens know that patient is in hospital, states she has had some "episodes". She believes it is because patient took herself off medications. Daughter is aware she isn't on patient's chart as someone we can speak with, but she is in the home with patients spouse, and she states we can call her to reach husband if needed.

## 2020-09-20 NOTE — Telephone Encounter (Signed)
Patient called in this morning and said she was seen at the ED 09/12/20 and told to follow up with Dr. Tomi Likens within 2-4 weeks.   She said she can only come in the afternoons and declined any morning visits offered.   Patient is currently on the schedule for 01/17/2021 and has been added to the wait list as a high priority.

## 2020-09-28 ENCOUNTER — Encounter: Payer: Self-pay | Admitting: Family Medicine

## 2020-09-28 ENCOUNTER — Other Ambulatory Visit: Payer: Self-pay

## 2020-09-28 ENCOUNTER — Ambulatory Visit (INDEPENDENT_AMBULATORY_CARE_PROVIDER_SITE_OTHER): Payer: Medicare Other | Admitting: Family Medicine

## 2020-09-28 VITALS — BP 120/70 | HR 80 | Resp 12 | Ht 59.0 in | Wt 113.0 lb

## 2020-09-28 DIAGNOSIS — Z23 Encounter for immunization: Secondary | ICD-10-CM

## 2020-09-28 DIAGNOSIS — R569 Unspecified convulsions: Secondary | ICD-10-CM | POA: Diagnosis not present

## 2020-09-28 DIAGNOSIS — I6930 Unspecified sequelae of cerebral infarction: Secondary | ICD-10-CM

## 2020-09-28 DIAGNOSIS — E782 Mixed hyperlipidemia: Secondary | ICD-10-CM | POA: Diagnosis not present

## 2020-09-28 MED ORDER — ROSUVASTATIN CALCIUM 5 MG PO TABS: 5.0000 mg | ORAL_TABLET | Freq: Every day | ORAL | 3 refills | Status: DC

## 2020-09-28 NOTE — Assessment & Plan Note (Addendum)
Overall she is tolerating new regimen well. No episodes since hospital discharge. Her husband is driving. Following with neuro. We reviewed some side effects of medication (up to date).

## 2020-09-28 NOTE — Assessment & Plan Note (Addendum)
Recent TIA episode. Continue Aspirin 81 mg and Crestor 5 mg daily. Some side effects of Aspirin discussed as well as benefits of statin meds.

## 2020-09-28 NOTE — Progress Notes (Signed)
Chief Complaint  Patient presents with  . Hospitalization Follow-up   HPI: Allison Roberson is a 79 y.o. female, who is here today to follow on recent hospitalization. She was evaluated in the ER on 09/12/20, admitted with Dx of absence seizure and aphasia. Discharged home on 09/14/20.  She had an episode of seizure like while eating dinner. Suddenly she became unresponsive, staring out, lasted a few seconds, then she could not speak. EMS was called by family.  Back to her baseline , no residual deficit.  According to pt, she had discontinued Keppra a couple days before hospitalization, medication was causing generalized skin pruritus.  Head CT on 09/12/20:Mild diffusecortical atrophy. Mild chronic ischemic white matter disease. Old right cerebellar infarction. No acute intracranial abnormality seen.   Brain MRI done on 09/13/20: Mildly motion degraded examination.  No evidence of acute intracranial abnormality, including acute infarction.   Stable MRI appearance of the brain as compared to 06/19/2020.   Chronic infarcts are present within the bilateral cerebral white matter and right greater than left cerebellar hemispheres.   Background mild generalized cerebral atrophy and cerebral white matter chronic small vessel ischemic disease.  As before, there is subtle asymmetric prominence of the right temporal horn without definite hippocampal volume loss or signal abnormality. Consider correlation with EEG.    Swallowing studies otherwise normal. Specimen:  Blood  Ref Range & Units 2 wk ago  WBC 4.4 - 11.0 x 10*3/uL 9.9      RBC 4.10 - 5.10 x 10*6/uL 4.51      Hemoglobin 12.3 - 15.3 G/DL 14.3      Hematocrit 35.9 - 44.6 % 41.6      MCV 80.0 - 96.0 FL 92.3      MCH 27.5 - 33.2 PG 31.6      MCHC 33.0 - 37.0 G/DL 34.2      RDW 12.3 - 17.0 % 13.3      Platelets 150 - 450 X 10*3/uL 186      MPV 6.8 - 10.2 FL 7.5     B12 > 1500. Mg 2.0. CK normal at  102.  Contains abnormal dataBasic Metabolic Panel Specimen:  Blood  Ref Range & Units 2 wk ago Comments  Sodium 135 - 146 MMOL/L 139        Potassium 3.5 - 5.3 MMOL/L 3.6        Chloride 98 - 110 MMOL/L 105        CO2 23 - 30 MMOL/L 25        BUN 8 - 24 MG/DL 11        Glucose 70 - 99 MG/DL 101High  Patients taking eltrombopag at doses >/= 100 mg daily may show falsely elevated values of 10% or greater.      Creatinine 0.50 - 1.50 MG/DL 0.77        Calcium 8.5 - 10.5 MG/DL 9.4        Anion Gap 4 - 14 MMOL/L 9        Est. GFR Non-African American >=60 ML/MIN/1.73 M*2  75      Discharged Dx: Possible TIA.  Tomorrow she has appt with cardiologist for heart monitor placement.  Seizure disorder: States that she is not longer on Keppra. She is now on Zonisamide 100 mg and Lacosamide 50 mg. She is concerned about possible side effects, mainly dental/gum hypertrophy.  She is not driving. No new events since hospital discharge.  HLD: She has tolerated Crestor 5  mg , taking med daily. She is also on Aspirin 81 mg daily.  Lab Results  Component Value Date   CHOL 272 (H) 12/09/2019   HDL 56.30 12/09/2019   LDLDIRECT 166.0 12/09/2019   TRIG 294.0 (H) 12/09/2019   CHOLHDL 5 12/09/2019   Review of Systems  Constitutional: Negative for activity change, appetite change, fatigue and fever.  HENT: Negative for mouth sores, nosebleeds and sore throat.   Eyes: Negative for redness and visual disturbance.  Respiratory: Negative for cough, shortness of breath and wheezing.   Cardiovascular: Negative for chest pain, palpitations and leg swelling.  Gastrointestinal: Negative for abdominal pain, nausea and vomiting.       Negative for changes in bowel habits.  Genitourinary: Negative for decreased urine volume, dysuria and hematuria.  Musculoskeletal: Negative for gait problem and myalgias.  Neurological: Negative for syncope, weakness and headaches.  Psychiatric/Behavioral:  Negative for confusion and hallucinations.  Rest see pertinent positives and negatives per HPI.  Current Outpatient Medications on File Prior to Visit  Medication Sig Dispense Refill  . Ascorbic Acid (VITAMIN C) 1000 MG tablet Take 1,000 mg by mouth daily.     [name] aspirin 81 MG chewable tablet Chew 81 mg by mouth daily.     . Cholecalciferol (VITAMIN D3) 400 units CAPS Take 400 Units by mouth daily.     . Cyanocobalamin (VITAMIN B 12 PO) Take 1,000 mg by mouth daily.    . folic acid (FOLVITE) 563 MCG tablet Take 400 mcg by mouth daily.     . Garlic 8756 MG CAPS Take 1,000 mg by mouth daily.     . Glucosamine-Chondroitin (GLUCOSAMINE CHONDR COMPLEX PO) Take 1 tablet by mouth at bedtime.    [name] lacosamide (VIMPAT) 50 MG TABS tablet Take 1 tablet (50 mg total) by mouth 2 (two) times daily. 60 tablet 5  . levothyroxine (SYNTHROID) 50 MCG tablet TAKE 1 TABLET(50 MCG) BY MOUTH DAILY (Patient taking differently: Take 50 mcg by mouth daily. ) 90 tablet 3  . Multiple Vitamins-Minerals (CENTRUM SILVER) tablet Take 1 tablet by mouth at bedtime.     . Omega-3 Fatty Acids (FISH OIL) 1000 MG CAPS Take 1,000 mg by mouth daily.    . vitamin E 400 UNIT capsule Take 400 Units by mouth daily.     [name] zonisamide (ZONEGRAN) 100 MG capsule Take by mouth.    . levETIRAcetam (KEPPRA) 500 MG tablet Take 500 mg by mouth 2 (two) times daily.     No current facility-administered medications on file prior to visit.     Past Medical History:  Diagnosis Date  . Allergy   . Arthritis    hands  . Blood transfusion without reported diagnosis 1963   with child birth  . Cancer [facility])    uterine cancer - age 67   . Cataract    small, watching   . GERD (gastroesophageal reflux disease)   . Heart murmur    with pregnancy- none noted after pregnancies   . Hyperlipidemia   . Osteopenia    gets prolia every 6 months   . Stroke [facility])    TIA x 3 per pt- 05-2019 last episode - per note in care every where/ neuro note not CVA  but old infarts noted on testing   . Thyroid disease    No Known Allergies  Social History   Socioeconomic History  . Marital status: Married    Spouse name: Not on file  . Number of children: 2  .  Years of education: Not on file  . Highest education level: High school graduate  Occupational History  . Not on file  Tobacco Use  . Smoking status: Never Smoker  . Smokeless tobacco: Never Used  Vaping Use  . Vaping Use: Never used  Substance and Sexual Activity  . Alcohol use: No  . Drug use: No  . Sexual activity: Never  Other Topics Concern  . Not on file  Social History Narrative   Social Review      Patient lives at home with.SPOUSE      Pt lives in a one or two story home? 2 STORY HOME      Does patient lives in a facility? If so where? PRIVATE HOME      What is patient highest level of education? HIGH SCHOOL GRADUATE      Is patient RIGHT or LEFT handed? RIGHT HANDED      Does patient drink coffee, tea, soda? YES      How much coffee, tea, soda does patient drink? SODA SOMETIMES, COFFEE ONCE A WEEK      Does patient exercise regularly? YES SHE HAS EXERCISE BIKE IN HER HOME   Social Determinants of Health   Financial Resource Strain:   . Difficulty of Paying Living Expenses: Not on file  Food Insecurity:   . Worried About Charity fundraiser in the Last Year: Not on file  . Ran Out of Food in the Last Year: Not on file  Transportation Needs:   . Lack of Transportation (Medical): Not on file  . Lack of Transportation (Non-Medical): Not on file  Physical Activity:   . Days of Exercise per Week: Not on file  . Minutes of Exercise per Session: Not on file  Stress:   . Feeling of Stress : Not on file  Social Connections:   . Frequency of Communication with Friends and Family: Not on file  . Frequency of Social Gatherings with Friends and Family: Not on file  . Attends Religious Services: Not on file  . Active Member of Clubs or Organizations: Not on file   . Attends Archivist Meetings: Not on file  . Marital Status: Not on file   Vitals:   09/28/20 1402  BP: 120/70  Pulse: 80  Resp: 12  SpO2: 92%   Body mass index is 22.82 kg/m.  Physical Exam Vitals and nursing note reviewed.  Constitutional:      General: She is not in acute distress.    Appearance: She is well-developed and normal weight.  HENT:     Head: Normocephalic and atraumatic.     Mouth/Throat:     Mouth: Mucous membranes are moist.     Pharynx: Oropharynx is clear.  Eyes:     Conjunctiva/sclera: Conjunctivae normal.     Pupils: Pupils are equal, round, and reactive to light.  Cardiovascular:     Rate and Rhythm: Normal rate and regular rhythm.     Pulses:          Dorsalis pedis pulses are 2+ on the right side and 2+ on the left side.     Heart sounds: No murmur heard.   Pulmonary:     Effort: Pulmonary effort is normal. No respiratory distress.     Breath sounds: Normal breath sounds.  Abdominal:     Palpations: Abdomen is soft. There is no hepatomegaly or mass.     Tenderness: There is no abdominal tenderness.  Lymphadenopathy:  Cervical: No cervical adenopathy.  Skin:    General: Skin is warm.     Findings: No erythema or rash.  Neurological:     Mental Status: She is alert and oriented to person, place, and time.     Cranial Nerves: No cranial nerve deficit.     Gait: Gait normal.  Psychiatric:        Mood and Affect: Mood and affect normal.     Comments: Well groomed, good eye contact.   ASSESSMENT AND PLAN:  Allison Roberson was seen today for hospitalization follow-up.  Diagnoses and all orders for this visit:  Orders Placed This Encounter  Procedures  . Flu Vaccine QUAD High Dose(Fluad)   Mixed hyperlipidemia She has tolerated Crestor well, continue 5 mg daily. LDL goal < 100,ideally < 70.  Seizure ([place]) Overall she is tolerating new regimen well. No episodes since hospital discharge. Her husband is driving. Following with  neuro. We reviewed some side effects of medication (up to date).  Chronic cerebrovascular accident (CVA) Recent TIA episode. Continue Aspirin 81 mg and Crestor 5 mg daily. Some side effects of Aspirin discussed as well as benefits of statin meds.  Need for influenza vaccination -     Flu Vaccine QUAD High Dose(Fluad)   Return in about 2 months (around 12/09/2020) for cpe.   Allison Kuhnert G. Martinique, MD  [facility]. [place] office.  A few things to remember from today's visit: No changes today. Will check cholesterol next visit.  If you need refills please call your pharmacy. Do not use My Chart to request refills or for acute issues that need immediate attention.    Please be sure medication list is accurate. If a new problem present, please set up appointment sooner than planned today.

## 2020-09-28 NOTE — Patient Instructions (Addendum)
A few things to remember from today's visit: No changes today. Will check cholesterol next visit.  If you need refills please call your pharmacy. Do not use My Chart to request refills or for acute issues that need immediate attention.    Please be sure medication list is accurate. If a new problem present, please set up appointment sooner than planned today.

## 2020-09-28 NOTE — Assessment & Plan Note (Signed)
She has tolerated Crestor well, continue 5 mg daily. LDL goal < 100,ideally < 70.

## 2020-10-03 ENCOUNTER — Encounter: Payer: Medicare Other | Admitting: Family Medicine

## 2020-10-03 ENCOUNTER — Encounter: Payer: Self-pay | Admitting: Family Medicine

## 2020-10-04 ENCOUNTER — Ambulatory Visit: Payer: Medicare Other | Admitting: Neurology

## 2020-10-04 ENCOUNTER — Other Ambulatory Visit: Payer: Self-pay

## 2020-10-04 ENCOUNTER — Encounter: Payer: Self-pay | Admitting: Neurology

## 2020-10-04 VITALS — BP 147/74 | HR 96 | Ht 59.0 in | Wt 111.4 lb

## 2020-10-04 DIAGNOSIS — R569 Unspecified convulsions: Secondary | ICD-10-CM

## 2020-10-04 MED ORDER — ZONISAMIDE 100 MG PO CAPS: 200.0000 mg | ORAL_CAPSULE | Freq: Every day | ORAL | 5 refills | Status: DC

## 2020-10-04 NOTE — Patient Instructions (Signed)
1.Continue zonisamide 200mg  daily, take it exactly as directed.  Do not stop taking the medicine without talking to your doctor first, even if you have not had a seizure in a long time.   2. Avoid activities in which a seizure would cause danger to yourself or to others.  Don't operate dangerous machinery, swim alone, or climb in high or dangerous places, such as on ladders, roofs, or girders.  Do not drive unless your doctor says you may.  3. If you have any warning that you may have a seizure, lay down in a safe place where you can't hurt yourself.    4.  No driving for 6 months from last seizure, as per [facility].   Please refer to the following link on the [facility] website for more information: [email or web address]   5.  Maintain good sleep hygiene.  6.  Notify your neurology if you are planning pregnancy or if you become pregnant.  7.  Contact your doctor if you have any problems that may be related to the medicine you are taking.  8.  Call 911 and bring the patient back to the ED if:        A.  The seizure lasts longer than 5 minutes.       B.  The patient doesn't awaken shortly after the seizure  C.  The patient has new problems such as difficulty seeing, speaking or moving  D.  The patient was injured during the seizure  E.  The patient has a temperature over 102 F (39C)  F.  The patient vomited and now is having trouble breathing       9.  Follow up in 6 months.

## 2020-10-04 NOTE — Progress Notes (Signed)
NEUROLOGY FOLLOW UP OFFICE NOTE  Allison Roberson [id number]  HISTORY OF PRESENT ILLNESS: Allison Roberson is a 79 year oldright-handed white female with hyperlipidemia, TMJ dysfunction, thyroid disease and remote history of uterine cancer who follows up for seizures.  UPDATE: Last seen in July.  She stopped Keppra due to side effects.  She was switched to Vimpat but stopped also due to side effects and did not contact me.  She was off AED for several weeks when she had recurrent seizure-like spell.  On 09/12/2020, she was eating dinner with family when she started playing with her food and was aphasic.  She was admitted to [facility].  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  EEG was normal.  She was started on zonisamide 200mg  daily and was discharged with order for outpatient ZIO patch, which she is currently wearing.  No recurrent spells since discharge.  Tolerating the zonisamide.  Not driving.  HISTORY: On 05/20/2019,she was sleeping in bed when her husband heard change in breathing. He was unable to wake her up and he couldn't. No associated shaking, incontinence or tongue biting. EMS was contacted and she was brought to [facility]. In the ED, she appeared confused and did not remember the episode. CBC, electrolytes, troponin and EKG were unremarkable. She was admitted for further evaluation. She had an apnea link performed which did not show any episodes of apnea but her O2 saturation dropped below 80% several times. CT chest showed questionable atelectasis versus infiltrate in upper lobes. She did not exhibit any fever or upper respiratory symptoms but was started on empiric IV antibiotics and Lasix. She demonstrated no further nocturnal hypoxia. CT of head was negative for acute intracranial abnormality. 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 No definitive diagnosis was established. She was discharged with home oxygen and recommendation for outpatient sleep study and neurology follow up. Routine EEG on 10/19/2019 showed intermittent left temporal sharply-contoured slowing but no clear epileptiform discharges.  A 24 hour ambulatory EEG from 10/28/2019 to 10/29/2019 was normal.   She was admitted to [facility] on 06/19/2020 after she was found down by her husband unresponsive and not breathing.  He started CPR and 911 was called.  She was awake once EMS arrived.  MRI of brain was limited due to motion artifact but chronic small vessel ischemic changes noted and no acute abnormality.  EEG was unremarkable.  Seizure was suspected and she was started on Keppra 500mg  twice daily but has only been taking 250mg  twice daily.  No recurrent spells.    PAST MEDICAL HISTORY: Past Medical History:  Diagnosis Date  . Allergy   . Arthritis    hands  . Blood transfusion without reported diagnosis 1963   with child birth  . Cancer [facility])    uterine cancer - age 79   . Cataract    small, watching   . GERD (gastroesophageal reflux disease)   . Heart murmur    with pregnancy- none noted after pregnancies   . Hyperlipidemia   . Osteopenia    gets prolia every 6 months   . Stroke [facility])    TIA x 3 per pt- 05-2019 last episode - per note in care every where/ neuro note not CVA but  old infarts noted on testing   . Thyroid disease     MEDICATIONS: Current Outpatient Medications on File Prior to Visit  Medication Sig Dispense Refill  . Ascorbic Acid (VITAMIN C) 1000 MG tablet Take 1,000 mg by mouth daily.     [name] aspirin 81 MG chewable tablet Chew 81 mg by mouth daily.     . Cholecalciferol (VITAMIN D3) 400 units CAPS Take 400 Units by mouth daily.     . Cyanocobalamin (VITAMIN B 12  PO) Take 1,000 mg by mouth daily.    . folic acid (FOLVITE) 389 MCG tablet Take 400 mcg by mouth daily.     . Garlic 3734 MG CAPS Take 1,000 mg by mouth daily.     . Glucosamine-Chondroitin (GLUCOSAMINE CHONDR COMPLEX PO) Take 1 tablet by mouth at bedtime.    [name] lacosamide (VIMPAT) 50 MG TABS tablet Take 1 tablet (50 mg total) by mouth 2 (two) times daily. 60 tablet 5  . levETIRAcetam (KEPPRA) 500 MG tablet Take 500 mg by mouth 2 (two) times daily.    [name] levothyroxine (SYNTHROID) 50 MCG tablet TAKE 1 TABLET(50 MCG) BY MOUTH DAILY (Patient taking differently: Take 50 mcg by mouth daily. ) 90 tablet 3  . Multiple Vitamins-Minerals (CENTRUM SILVER) tablet Take 1 tablet by mouth at bedtime.     . Omega-3 Fatty Acids (FISH OIL) 1000 MG CAPS Take 1,000 mg by mouth daily.    . rosuvastatin (CRESTOR) 5 MG tablet Take 1 tablet (5 mg total) by mouth daily. 90 tablet 3  . vitamin E 400 UNIT capsule Take 400 Units by mouth daily.     [name] zonisamide (ZONEGRAN) 100 MG capsule Take by mouth.     No current facility-administered medications on file prior to visit.    ALLERGIES: No Known Allergies  FAMILY HISTORY: Family History  Problem Relation Age of Onset  . Colon polyps Son   . Colon cancer Neg Hx   . Rectal cancer Neg Hx   . Stomach cancer Neg Hx   . Pancreatic cancer Neg Hx   . Esophageal cancer Neg Hx     SOCIAL HISTORY: Social History   Socioeconomic History  . Marital status: Married    Spouse name: Not on file  . Number of children: 2  . Years of education: Not on file  . Highest education level: High school graduate  Occupational History  . Not on file  Tobacco Use  . Smoking status: Never Smoker  . Smokeless tobacco: Never Used  Vaping Use  . Vaping Use: Never used  Substance and Sexual Activity  . Alcohol use: No  . Drug use: No  . Sexual activity: Never  Other Topics Concern  . Not on file  Social History Narrative   Social Review      Patient lives at home  with.SPOUSE      Pt lives in a one or two story home? 2 STORY HOME      Does patient lives in a facility? If so where? PRIVATE HOME      What is patient highest level of education? HIGH SCHOOL GRADUATE      Is patient RIGHT or LEFT handed? RIGHT HANDED      Does patient drink coffee, tea, soda? YES      How much coffee, tea, soda does patient drink? SODA SOMETIMES, COFFEE ONCE A WEEK      Does patient exercise regularly? YES SHE HAS EXERCISE BIKE IN HER HOME  Social Determinants of Health   Financial Resource Strain:   . Difficulty of Paying Living Expenses: Not on file  Food Insecurity:   . Worried About Charity fundraiser in the Last Year: Not on file  . Ran Out of Food in the Last Year: Not on file  Transportation Needs:   . Lack of Transportation (Medical): Not on file  . Lack of Transportation (Non-Medical): Not on file  Physical Activity:   . Days of Exercise per Week: Not on file  . Minutes of Exercise per Session: Not on file  Stress:   . Feeling of Stress : Not on file  Social Connections:   . Frequency of Communication with Friends and Family: Not on file  . Frequency of Social Gatherings with Friends and Family: Not on file  . Attends Religious Services: Not on file  . Active Member of Clubs or Organizations: Not on file  . Attends Archivist Meetings: Not on file  . Marital Status: Not on file  Intimate Partner Violence:   . Fear of Current or Ex-Partner: Not on file  . Emotionally Abused: Not on file  . Physically Abused: Not on file  . Sexually Abused: Not on file   PHYSICAL EXAM: Blood pressure (!) 147/74, pulse 96, height 4\' 11"  (1.499 m), weight 111 lb 6.4 oz (50.5 kg), SpO2 (!) 86 %. General: No acute distress.  Patient appears well-groomed.   Head:  Normocephalic/atraumatic Eyes:  Fundi examined but not visualized Neck: supple, no paraspinal tenderness, full range of motion Heart:  Regular rate and rhythm Lungs:  Clear to auscultation  bilaterally Back: No paraspinal tenderness Neurological Exam: alert and oriented to person, place, and time. Attention span and concentration intact, recent and remote memory intact, fund of knowledge intact.  Speech fluent and not dysarthric, language intact.  CN II-XII intact. Bulk and tone normal, muscle strength 5/5 throughout.  Sensation to light touch, temperature and vibration intact.  Deep tendon reflexes 2+ throughout, toes downgoing.  Finger to nose and heel to shin testing intact.  Gait normal, Romberg negative.  IMPRESSION: Seizure-like events, possible complex partial seizures  PLAN: 1.  zonisamide 200mg  daily refilled 2.  Discussed [place] law stating that she must be seizure-free for 6 months before resuming driving.   3.  Follow up in 6 months.  Metta Clines, DO  CC:  Betty Martinique, MD

## 2020-10-31 ENCOUNTER — Telehealth: Payer: Self-pay | Admitting: Neurology

## 2020-10-31 DIAGNOSIS — Z79899 Other long term (current) drug therapy: Secondary | ICD-10-CM

## 2020-10-31 DIAGNOSIS — R569 Unspecified convulsions: Secondary | ICD-10-CM

## 2020-10-31 MED ORDER — OXCARBAZEPINE 150 MG PO TABS: 150.0000 mg | ORAL_TABLET | Freq: Two times a day (BID) | ORAL | 3 refills | Status: DC

## 2020-10-31 NOTE — Telephone Encounter (Signed)
Telephone call to pt, Pt states that the symptoms started about 5 weeks ago. Her heart is rasing, she is staggering when she walks, Breathing hard. She having muscle pain in her legs. The symptoms sometime start at least a couple minutes to an hour after taking the medication.    Please advise.

## 2020-10-31 NOTE — Telephone Encounter (Signed)
We can start an alternative medication, oxcarbazepine:  Please prescribe 150mg  twice daily for one week, then 300mg  twice daily.  I would like to check a CMP for medication management as it may lower sodium levels. At the same time, she may decrease zonisamide to 100mg  daily for one week, then stop

## 2020-10-31 NOTE — Telephone Encounter (Signed)
Patient states that she is having problems on the medication Zonisamide  She states that is making her heart beat fast, problems with breathing  Legs hurting and issues with stomach not sleeping and other things  Please call

## 2020-10-31 NOTE — Telephone Encounter (Signed)
Pt advised, orders added

## 2020-12-12 ENCOUNTER — Encounter: Payer: Medicare Other | Admitting: Family Medicine

## 2020-12-13 ENCOUNTER — Encounter: Payer: Self-pay | Admitting: Family Medicine

## 2020-12-13 ENCOUNTER — Other Ambulatory Visit: Payer: Self-pay

## 2020-12-13 ENCOUNTER — Ambulatory Visit (INDEPENDENT_AMBULATORY_CARE_PROVIDER_SITE_OTHER): Payer: Medicare Other | Admitting: Family Medicine

## 2020-12-13 VITALS — BP 132/76 | HR 77 | Temp 97.7°F | Resp 16 | Ht 59.0 in | Wt 110.8 lb

## 2020-12-13 DIAGNOSIS — E039 Hypothyroidism, unspecified: Secondary | ICD-10-CM

## 2020-12-13 DIAGNOSIS — R739 Hyperglycemia, unspecified: Secondary | ICD-10-CM | POA: Diagnosis not present

## 2020-12-13 DIAGNOSIS — E782 Mixed hyperlipidemia: Secondary | ICD-10-CM | POA: Diagnosis not present

## 2020-12-13 DIAGNOSIS — Z0000 Encounter for general adult medical examination without abnormal findings: Secondary | ICD-10-CM

## 2020-12-13 DIAGNOSIS — Z1159 Encounter for screening for other viral diseases: Secondary | ICD-10-CM

## 2020-12-13 NOTE — Progress Notes (Signed)
HPI: [name] is a 79 y.o. female, who is here today for her routine physical.  Last CPE: 12/09/19.  Regular exercise 3 or more time per week: She has not been consistent. Used to do the stationary bike. Following a healthy diet: She cooks at home, she eats vegetable and loves pineapples and grapes. She lives with her husband and her daughter.  Chronic medical problems: Seizure disorder,HLD,hypothyroidism, OA,GERD,and allergies among some. TIA on Aspirin 81 mg daily.  Immunization History  Administered Date(s) Administered  . Fluad Quad(high Dose 65+) 09/28/2020  . Influenza, High Dose Seasonal PF 09/14/2019  . PFIZER SARS-COV-2 Vaccination 12/18/2019, 02/15/2020  . Pneumococcal Conjugate-13 11/08/2014  . Pneumococcal Polysaccharide-23 11/03/2014  . Pneumococcal-Unspecified 09/06/2008  . Zoster 09/06/2008    Mammogram: 01/2018, has an appt on 12/15/20. Colonoscopy: 06/26/17. DEXA: 11/2014, osteopenia left hip. She has been on  bisphosphonate and prolia in the past, the latter one she took for 5 years. She is on vit D supplementation. Hep C screening: Never.  She has no new concerns today.  Hypothyroidism: She is on Levothyroxine 50 mcg daily. Last TSH on 01/20/20 was normal at 4.3.  HLD: Currently she is on Crestor 5 mg daily. She is tolerating medication well.  Component     Latest Ref Rng & Units 12/09/2019  Cholesterol     0 - 200 mg/dL 272 (H)  Triglycerides     0.0 - 149.0 mg/dL 294.0 (H)  HDL Cholesterol     >39.00 mg/dL 56.30  VLDL     0.0 - 40.0 mg/dL 58.8 (H)  Total CHOL/HDL Ratio      5  NonHDL      215.70   Glucose has been elevated at 128 in 06/2020. No hx of diabetes.  Seizure disorder: Following with neuro. Medications still being adjusted. She has not have episodes since 08/2020. She is not driving yet but looking forward to doing so.  Review of Systems  Constitutional: Negative for appetite change, fatigue and fever.  HENT:  Negative for dental problem, hearing loss, mouth sores and sore throat.   Eyes: Negative for redness and visual disturbance.  Respiratory: Negative for cough, shortness of breath and wheezing.   Cardiovascular: Negative for chest pain and leg swelling.  Gastrointestinal: Negative for abdominal pain, nausea and vomiting.       No changes in bowel habits.  Endocrine: Negative for cold intolerance, heat intolerance, polydipsia, polyphagia and polyuria.  Genitourinary: Negative for decreased urine volume, dysuria, hematuria, vaginal bleeding and vaginal discharge.  Musculoskeletal: Positive for arthralgias. Negative for gait problem and myalgias.  Skin: Negative for color change and rash.  Allergic/Immunologic: Positive for environmental allergies.  Neurological: Negative for syncope, weakness and headaches.  Hematological: Negative for adenopathy. Does not bruise/bleed easily.  Psychiatric/Behavioral: Negative for confusion and sleep disturbance. The patient is not nervous/anxious.   All other systems reviewed and are negative.  Current Outpatient Medications on File Prior to Visit  Medication Sig Dispense Refill  . Ascorbic Acid (VITAMIN C) 1000 MG tablet Take 1,000 mg by mouth daily.     [name] aspirin 81 MG chewable tablet Chew 81 mg by mouth daily.     . Cholecalciferol (VITAMIN D3) 400 units CAPS Take 400 Units by mouth daily.     . Cyanocobalamin (VITAMIN B 12 PO) Take 1,000 mg by mouth daily.    . folic acid (FOLVITE) 161 MCG tablet Take 400 mcg by mouth daily.  (Patient not taking: Reported on 10/04/2020)    .  Garlic 3559 MG CAPS Take 1,000 mg by mouth daily.     . Glucosamine-Chondroitin (GLUCOSAMINE CHONDR COMPLEX PO) Take 1 tablet by mouth at bedtime.    [name] lacosamide (VIMPAT) 50 MG TABS tablet Take 1 tablet (50 mg total) by mouth 2 (two) times daily. 60 tablet 5  . levETIRAcetam (KEPPRA) 500 MG tablet Take 500 mg by mouth 2 (two) times daily.    [name] levothyroxine (SYNTHROID) 50 MCG tablet  TAKE 1 TABLET(50 MCG) BY MOUTH DAILY (Patient taking differently: Take 50 mcg by mouth daily. ) 90 tablet 3  . Multiple Vitamins-Minerals (CENTRUM SILVER) tablet Take 1 tablet by mouth at bedtime.     . Omega-3 Fatty Acids (FISH OIL) 1000 MG CAPS Take 1,000 mg by mouth daily.    . OXcarbazepine (TRILEPTAL) 150 MG tablet Take 1 tablet (150 mg total) by mouth 2 (two) times daily. Take  165m twice daily for one week, then 3021mtwice daily. 90 tablet 3  . rosuvastatin (CRESTOR) 5 MG tablet Take 1 tablet (5 mg total) by mouth daily. 90 tablet 3  . vitamin E 400 UNIT capsule Take 400 Units by mouth daily.     . Marland Kitchenonisamide (ZONEGRAN) 100 MG capsule Take 2 capsules (200 mg total) by mouth daily. 60 capsule 5   No current facility-administered medications on file prior to visit.   Past Medical History:  Diagnosis Date  . Allergy   . Arthritis    hands  . Blood transfusion without reported diagnosis 1963   with child birth  . [facility]   [facility] - age [age] . Cataract    small, watching   . GERD (gastroesophageal reflux disease)   . Heart murmur    with pregnancy- none noted after pregnancies   . Hyperlipidemia   . Osteopenia    gets prolia every 6 months   . [facility]   TIA x 3 per pt- 05-2019 last episode - per note in care every where/ neuro note not CVA but old infarts noted on testing   . Thyroid disease     Past Surgical History:  Procedure Laterality Date  . ABDOMINAL HYSTERECTOMY  1969  . COLONOSCOPY  08/05/2006   tic, moderate   . PARTIAL HYSTERECTOMY     has ovaries  . POLYPECTOMY     No Known Allergies  Family History  Problem Relation Age of Onset  . Colon polyps Son   . Colon cancer Neg Hx   . Rectal cancer Neg Hx   . Stomach cancer Neg Hx   . Pancreatic cancer Neg Hx   . Esophageal cancer Neg Hx     Social History   Socioeconomic History  . Marital status: Married    Spouse name: Not on file  . Number of children: 2  . Years of education:  Not on file  . Highest education level: High school graduate  Occupational History  . Not on file  Tobacco Use  . Smoking status: Never Smoker  . Smokeless tobacco: Never Used  Vaping Use  . Vaping Use: Never used  Substance and Sexual Activity  . Alcohol use: No  . Drug use: No  . Sexual activity: Never  Other Topics Concern  . Not on file  Social History Narrative   Social Review      Patient lives at home with.SPOUSE      Pt lives in a one or two story home? [address]  Does patient lives in a facility? If so where? PRIVATE HOME      What is patient highest level of education? HIGH SCHOOL GRADUATE      Is patient RIGHT or LEFT handed? RIGHT HANDED      Does patient drink coffee, tea, soda? YES      How much coffee, tea, soda does patient drink? SODA SOMETIMES, COFFEE ONCE A WEEK      Does patient exercise regularly? YES SHE HAS EXERCISE BIKE IN HER HOME   Social Determinants of Health   Financial Resource Strain: Not on file  Food Insecurity: Not on file  Transportation Needs: Not on file  Physical Activity: Not on file  Stress: Not on file  Social Connections: Not on file   Vitals:   12/13/20 1419  BP: 132/76  Pulse: 77  Resp: 16  Temp: 97.7 F (36.5 C)  SpO2: 98%   Body mass index is 22.38 kg/m.   Wt Readings from Last 3 Encounters:  12/13/20 110 lb 12.8 oz (50.3 kg)  10/04/20 111 lb 6.4 oz (50.5 kg)  09/28/20 113 lb (51.3 kg)    Physical Exam Vitals and nursing note reviewed.  Constitutional:      General: She is not in acute distress.    Appearance: She is well-developed.  HENT:     Head: Normocephalic and atraumatic.     Right Ear: Hearing, tympanic membrane, ear canal and external ear normal.     Left Ear: Hearing, tympanic membrane, ear canal and external ear normal.     Mouth/Throat:     Mouth: Oropharynx is clear and moist and mucous membranes are normal. Mucous membranes are moist.     Pharynx: Oropharynx is clear. Uvula  midline.  Eyes:     Extraocular Movements: Extraocular movements intact and EOM normal.     Conjunctiva/sclera: Conjunctivae normal.     Pupils: Pupils are equal, round, and reactive to light.  Neck:     Thyroid: No thyromegaly.     Trachea: No tracheal deviation.  Cardiovascular:     Rate and Rhythm: Normal rate and regular rhythm.     Pulses:          Dorsalis pedis pulses are 2+ on the right side and 2+ on the left side.     Heart sounds: No murmur heard.   Pulmonary:     Effort: Pulmonary effort is normal. No respiratory distress.     Breath sounds: Normal breath sounds.  Abdominal:     Palpations: Abdomen is soft. There is no hepatomegaly or mass.     Tenderness: There is no abdominal tenderness.  Musculoskeletal:        General: No edema.     Comments: No signs of synovitis.  Lymphadenopathy:     Cervical: No cervical adenopathy.  Skin:    General: Skin is warm.     Findings: No erythema or rash.  Neurological:     General: No focal deficit present.     Mental Status: She is alert and oriented to person, place, and time.     Cranial Nerves: No cranial nerve deficit.     Coordination: Coordination normal.     Gait: Gait normal.     Deep Tendon Reflexes: Strength normal.     Reflex Scores:      Bicep reflexes are 2+ on the right side and 2+ on the left side.      Patellar reflexes are 2+ on the right side and  2+ on the left side. Psychiatric:        Mood and Affect: Mood and affect normal. Mood is not anxious or depressed.     Comments: Well groomed, good eye contact.    ASSESSMENT AND PLAN:  Ms. Allison Roberson was here today annual physical examination.  Orders Placed This Encounter  Procedures  . TSH  . Lipid panel  . Hepatitis C antibody  . Hemoglobin A1c   Lab Results  Component Value Date   HGBA1C 5.7 12/13/2020   Lab Results  Component Value Date   CHOL 197 12/13/2020   HDL 69.80 12/13/2020   LDLCALC 92 12/13/2020   LDLDIRECT 166.0  12/09/2019   TRIG 176.0 (H) 12/13/2020   CHOLHDL 3 12/13/2020   Lab Results  Component Value Date   TSH 2.56 12/13/2020   Routine general medical examination at a health care facility We discussed the importance of regular physical activity and healthy diet for prevention of chronic illness and/or complications. Preventive guidelines reviewed. Vaccination up to date. Ca++ and vit D supplementation recommended. Fall precautions. Next CPE in a year.  Mixed hyperlipidemia Continue Crestor 5 mg daily. Further recommendations according to lab result.  Acquired hypothyroidism Continue Levothyroxine 50 mcg daily. We will adjust treatment, id needed, according to TSH result.  Hyperglycemia Healthy life style recommended for primary prevention of diabetes. Recommendations according to HgA1C.  Encounter for HCV screening test for low risk patient -     Hepatitis C antibody   Return in 1 year (on 12/13/2021).  Allison Shellhammer G. Martinique, MD  [facility]. [place] office.  A few things to remember from today's visit:   Mixed hyperlipidemia - Plan: Lipid panel  Acquired hypothyroidism - Plan: TSH  Hyperglycemia - Plan: Hemoglobin A1c  Encounter for HCV screening test for low risk patient - Plan: Hepatitis C antibody  Routine general medical examination at a health care facility  If you need refills please call your pharmacy. Do not use My Chart to request refills or for acute issues that need immediate attention.   Please be sure medication list is accurate. If a new problem present, please set up appointment sooner than planned today.  A few tips:  -As we age balance is not as good as it was, so there is a higher risks for falls. Please remove small rugs and furniture that is "in your way" and could increase the risk of falls. Stretching exercises may help with fall prevention: Yoga and Tai Chi are some examples. Low impact exercise is better, so you are not very achy  the next day.  -Sun screen and avoidance of direct sun light recommended. Caution with dehydration, if working outdoors be sure to drink enough fluids.  - Some medications are not safe as we age, increases the risk of side effects and can potentially interact with other medication you are also taken;  including some of over the counter medications. Be sure to let me know when you start a new medication even if it is a dietary/vitamin supplement.   -Healthy diet low in red meet/animal fat and sugar + regular physical activity is recommended.

## 2020-12-13 NOTE — Patient Instructions (Addendum)
A few things to remember from today's visit:   Mixed hyperlipidemia - Plan: Lipid panel  Acquired hypothyroidism - Plan: TSH  Hyperglycemia - Plan: Hemoglobin A1c  Encounter for HCV screening test for low risk patient - Plan: Hepatitis C antibody  Routine general medical examination at a health care facility  If you need refills please call your pharmacy. Do not use My Chart to request refills or for acute issues that need immediate attention.   Please be sure medication list is accurate. If a new problem present, please set up appointment sooner than planned today.  A few tips:  -As we age balance is not as good as it was, so there is a higher risks for falls. Please remove small rugs and furniture that is "in your way" and could increase the risk of falls. Stretching exercises may help with fall prevention: Yoga and Tai Chi are some examples. Low impact exercise is better, so you are not very achy the next day.  -Sun screen and avoidance of direct sun light recommended. Caution with dehydration, if working outdoors be sure to drink enough fluids.  - Some medications are not safe as we age, increases the risk of side effects and can potentially interact with other medication you are also taken;  including some of over the counter medications. Be sure to let me know when you start a new medication even if it is a dietary/vitamin supplement.   -Healthy diet low in red meet/animal fat and sugar + regular physical activity is recommended.

## 2020-12-14 LAB — LIPID PANEL
Cholesterol: 197 mg/dL (ref 0–200)
HDL: 69.8 mg/dL (ref >39.00)
LDL Cholesterol: 92 mg/dL (ref 0–99)
NonHDL: 127.3
Total CHOL/HDL Ratio: 3
Triglycerides: 176 mg/dL — ABNORMAL HIGH (ref 0.0–149.0)
VLDL: 35.2 mg/dL (ref 0.0–40.0)

## 2020-12-14 LAB — TSH: TSH: 2.56 u[IU]/mL (ref 0.35–4.50)

## 2020-12-14 LAB — HEPATITIS C ANTIBODY
Hepatitis C Ab: NONREACTIVE
SIGNAL TO CUT-OFF: 0.27 (ref <1.00)

## 2020-12-14 LAB — HEMOGLOBIN A1C: Hgb A1c MFr Bld: 5.7 % (ref 4.6–6.5)

## 2020-12-15 ENCOUNTER — Other Ambulatory Visit: Payer: Self-pay

## 2020-12-15 ENCOUNTER — Ambulatory Visit
Admission: RE | Admit: 2020-12-15 | Discharge: 2020-12-15 | Disposition: A | Discharge status: Home / Self Care | Payer: Medicare Other | Source: Ambulatory Visit | Attending: Family Medicine | Admitting: Family Medicine

## 2020-12-15 DIAGNOSIS — Z1231 Encounter for screening mammogram for malignant neoplasm of breast: Secondary | ICD-10-CM

## 2020-12-15 DIAGNOSIS — Z780 Asymptomatic menopausal state: Secondary | ICD-10-CM

## 2020-12-16 MED ORDER — LEVOTHYROXINE SODIUM 50 MCG PO TABS: 50.0000 ug | ORAL_TABLET | Freq: Every day | ORAL | 3 refills | Status: DC

## 2020-12-19 ENCOUNTER — Other Ambulatory Visit: Payer: Self-pay | Admitting: *Deleted

## 2020-12-19 DIAGNOSIS — M810 Age-related osteoporosis without current pathological fracture: Secondary | ICD-10-CM

## 2020-12-20 ENCOUNTER — Encounter: Payer: Self-pay | Admitting: Internal Medicine

## 2020-12-23 ENCOUNTER — Telehealth: Payer: Self-pay | Admitting: Family Medicine

## 2020-12-23 NOTE — Telephone Encounter (Signed)
Called spoke with patient, requested the number to Endocrinology

## 2020-12-23 NOTE — Telephone Encounter (Signed)
Pt call and stated she would like for you to give her a call.

## 2021-01-06 ENCOUNTER — Other Ambulatory Visit: Payer: Self-pay

## 2021-01-06 ENCOUNTER — Encounter: Payer: Self-pay | Admitting: Internal Medicine

## 2021-01-10 ENCOUNTER — Other Ambulatory Visit: Payer: Self-pay

## 2021-01-10 ENCOUNTER — Encounter: Payer: Self-pay | Admitting: Internal Medicine

## 2021-01-10 ENCOUNTER — Ambulatory Visit: Payer: Medicare Other | Admitting: Internal Medicine

## 2021-01-10 VITALS — BP 142/90 | HR 81 | Ht 59.0 in | Wt 113.8 lb

## 2021-01-10 DIAGNOSIS — M810 Age-related osteoporosis without current pathological fracture: Secondary | ICD-10-CM

## 2021-01-10 DIAGNOSIS — Z79899 Other long term (current) drug therapy: Secondary | ICD-10-CM | POA: Diagnosis not present

## 2021-01-10 LAB — COMPREHENSIVE METABOLIC PANEL
ALT: 16 U/L (ref 0–35)
AST: 18 U/L (ref 0–37)
Albumin: 4.4 g/dL (ref 3.5–5.2)
Alkaline Phosphatase: 123 U/L — ABNORMAL HIGH (ref 39–117)
BUN: 13 mg/dL (ref 6–23)
CO2: 30 mEq/L (ref 19–32)
Calcium: 9.7 mg/dL (ref 8.4–10.5)
Chloride: 102 mEq/L (ref 96–112)
Creatinine, Ser: 0.72 mg/dL (ref 0.40–1.20)
GFR: 80.68 mL/min (ref >60.00)
Glucose, Bld: 93 mg/dL (ref 70–99)
Potassium: 4.1 mEq/L (ref 3.5–5.1)
Sodium: 139 mEq/L (ref 135–145)
Total Bilirubin: 0.5 mg/dL (ref 0.2–1.2)
Total Protein: 7.3 g/dL (ref 6.0–8.3)

## 2021-01-10 NOTE — Progress Notes (Signed)
Patient ID: Leslei Sumbry, female   DOB: 1941/07/11, 80 y.o.   MRN: [id number]  This visit occurred during the SARS-CoV-2 public health emergency.  Safety protocols were in place, including screening questions prior to the visit, additional usage of staff PPE, and extensive cleaning of exam room while observing appropriate contact time as indicated for disinfecting solutions.   HPI  Levinia Amneet Alvino is a 80 y.o.-year-old female, referred by her PCP, Dr. Martinique, for management of osteoporosis (OP).  Pt was dx with OP in 11/2020.  I reviewed pt's available DXA scan report: Date L1-L4 T score FN T score 33% distal Radius  12/15/2020 ([facility]) n/a RFN: -2.3 LFN: -1.9 -4.4  11/29/2014 ([facility]) -0.8 RFN: n/a LFN: -1.9 n/a  09/24/2012 ([facility]) -1.3 RFN: n/a LFN: -2.3 n/a   She denies fractures.  No falls.  No dizziness/vertigo/orthostasis/poor vision.   Previous OP treatments:  -Bisphosphonates per chart - pt. does not recall -Prolia x 5.5 years - last inj. 2019  No h/o vitamin D deficiency. Reviewed available vit D levels: Lab Results  Component Value Date   VD25OH 58.13 12/09/2019   VD25OH 58.02 02/17/2018   Pt is on: - vitamin D 10,000 units daily - for the last ~ year Stopped MVI per neurology advice.  No weight bearing exercises.  She uses an exercise bike at home.  She does not take high vitamin A doses.  No smoking, no alcohol use, no history of steroid use.  No RA.  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  Unknown family history of osteoporosis. Grew up separated from her family.  No h/o persistent hyper/hypocalcemia or hyperparathyroidism. No h/o kidney stones. Lab Results  Component Value Date   PTH 24 02/17/2018   CALCIUM 9.6 06/19/2020   CALCIUM 10.9 (H) 12/09/2019   CALCIUM 10.4 02/17/2018   No h/o thyrotoxicosis.  In fact, she has hypothyroidism (on LT4 50 mcg daily); reviewed TSH recent  levels:  Lab Results  Component Value Date   TSH 2.56 12/13/2020   TSH 1.83 12/09/2019   TSH 2.35 02/17/2018   No h/o CKD. 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  Last seizure 08/2020.  She also has a history of hiatal hernia per swallow study.  Husband has dementia.  ROS: Constitutional: no weight gain, no weight loss, no fatigue, no subjective hyperthermia, no subjective hypothermia, no nocturia Eyes: no blurry vision, no xerophthalmia ENT: no sore throat, no nodules palpated in neck, + dysphagia, no odynophagia, no hoarseness, no tinnitus, no hypoacusis Cardiovascular: no CP, no SOB, no palpitations, no leg swelling Respiratory: no cough, no SOB, no wheezing Gastrointestinal: no N, no V, no D, no C, no acid reflux Musculoskeletal: no muscle, no joint aches Skin: no rash, no hair loss, + itching  Neurological: no tremors, no numbness or tingling/no dizziness/no HAs Psychiatric: no depression, no anxiety  I reviewed pt's medications, allergies, PMH, social hx, family hx, and changes were documented in the history of present illness. Otherwise, unchanged from my initial visit note.  Past Medical History:  Diagnosis Date  . Allergy   . Arthritis    hands  . Blood transfusion without reported diagnosis 1963   with child birth  . Cancer [facility])    uterine cancer - age 20   .  Cataract    small, watching   . GERD (gastroesophageal reflux disease)   . Heart murmur    with pregnancy- none noted after pregnancies   . Hyperlipidemia   . Osteopenia    gets prolia every 6 months   . Stroke [facility])    TIA x 3 per pt- 05-2019 last episode - per note in care every where/ neuro note not CVA but old infarts noted on testing   . Thyroid disease    Past Surgical History:  Procedure Laterality Date  . ABDOMINAL HYSTERECTOMY  1969  . COLONOSCOPY  08/05/2006    tic, moderate   . PARTIAL HYSTERECTOMY     has ovaries  . POLYPECTOMY     Social History   Socioeconomic History  . Marital status: Married    Spouse name: Not on file  . Number of children: 2  . Years of education: Not on file  . Highest education level: High school graduate  Occupational History  . Not on file  Tobacco Use  . Smoking status: Never Smoker  . Smokeless tobacco: Never Used  Vaping Use  . Vaping Use: Never used  Substance and Sexual Activity  . Alcohol use: No  . Drug use: No  . Sexual activity: Never  Other Topics Concern  . Not on file  Social History Narrative   Social Review      Patient lives at home with.SPOUSE      Pt lives in a one or two story home? 2 STORY HOME      Does patient lives in a facility? If so where? PRIVATE HOME      What is patient highest level of education? HIGH SCHOOL GRADUATE      Is patient RIGHT or LEFT handed? RIGHT HANDED      Does patient drink coffee, tea, soda? YES      How much coffee, tea, soda does patient drink? SODA SOMETIMES, COFFEE ONCE A WEEK      Does patient exercise regularly? YES SHE HAS EXERCISE BIKE IN HER HOME   Social Determinants of Health   Financial Resource Strain: Not on file  Food Insecurity: Not on file  Transportation Needs: Not on file  Physical Activity: Not on file  Stress: Not on file  Social Connections: Not on file  Intimate Partner Violence: Not on file   Current Outpatient Medications on File Prior to Visit  Medication Sig Dispense Refill  . Ascorbic Acid (VITAMIN C) 1000 MG tablet Take 1,000 mg by mouth daily.     [name] aspirin 81 MG chewable tablet Chew 81 mg by mouth daily.     . Cholecalciferol (VITAMIN D3) 400 units CAPS Take 400 Units by mouth daily.     . Cyanocobalamin (VITAMIN B 12 PO) Take 1,000 mg by mouth daily.    . folic acid (FOLVITE) A999333 MCG tablet Take 400 mcg by mouth daily.  (Patient not taking: Reported on 10/04/2020)    . Garlic [id number] MG CAPS Take 1,000 mg by  mouth daily.     . Glucosamine-Chondroitin (GLUCOSAMINE CHONDR COMPLEX PO) Take 1 tablet by mouth at bedtime.    [name] lacosamide (VIMPAT) 50 MG TABS tablet Take 1 tablet (50 mg total) by mouth 2 (two) times daily. 60 tablet 5  . levETIRAcetam (KEPPRA) 500 MG tablet Take 500 mg by mouth 2 (two) times daily.    [name] levothyroxine (SYNTHROID) 50 MCG tablet Take 1 tablet (50 mcg total) by mouth daily. 90 tablet 3  .  Multiple Vitamins-Minerals (CENTRUM SILVER) tablet Take 1 tablet by mouth at bedtime.     . Omega-3 Fatty Acids (FISH OIL) 1000 MG CAPS Take 1,000 mg by mouth daily.    . OXcarbazepine (TRILEPTAL) 150 MG tablet Take 1 tablet (150 mg total) by mouth 2 (two) times daily. Take  150mg  twice daily for one week, then 300mg  twice daily. 90 tablet 3  . rosuvastatin (CRESTOR) 5 MG tablet Take 1 tablet (5 mg total) by mouth daily. 90 tablet 3  . vitamin E 400 UNIT capsule Take 400 Units by mouth daily.     [name] zonisamide (ZONEGRAN) 100 MG capsule Take 2 capsules (200 mg total) by mouth daily. 60 capsule 5   No current facility-administered medications on file prior to visit.   No Known Allergies Family History  Problem Relation Age of Onset  . Colon polyps Son   . Colon cancer Neg Hx   . Rectal cancer Neg Hx   . Stomach cancer Neg Hx   . Pancreatic cancer Neg Hx   . Esophageal cancer Neg Hx     PE: BP (!) 142/90   Pulse 81   Ht 4\' 11"  (1.499 m)   Wt 113 lb 12.8 oz (51.6 kg)   SpO2 98%   BMI 22.98 kg/m  Wt Readings from Last 3 Encounters:  01/10/21 113 lb 12.8 oz (51.6 kg)  12/13/20 110 lb 12.8 oz (50.3 kg)  10/04/20 111 lb 6.4 oz (50.5 kg)   Constitutional: Normal weight, in NAD. No kyphosis. Eyes: PERRLA, EOMI, no exophthalmos ENT: moist mucous membranes, no thyromegaly, no cervical lymphadenopathy Cardiovascular: RRR, No MRG, + slight periankle swelling in the left Respiratory: CTA B Gastrointestinal: abdomen soft, NT, ND, BS+ Musculoskeletal: no deformities, strength intact in  all 4 Skin: moist, warm, no rashes Neurological: no tremor with outstretched hands, DTR normal in all 4  Assessment: 1. Osteoporosis  Plan: 1. Osteoporosis - likely postmenopausal/age-related - Discussed about increased risk of fracture, depending on the T score, greatly increased when the T score is lower than -2.5, but it is actually a continuum and -2.5 should not be regarded as an absolute threshold. We reviewed her last 3 DXA scan report together, and I explained that based on the T scores, she has an increased risk for fractures.  Her lowest T score is in the 33% distal radius per last DXA report.  I do not have the full previous reports so I am not sure if this site was checked before.  Between 2013 and 2015, there has been significant improvement in her bone density and, upon questioning, this has been most likely due to her being on Prolia. -She describes that she stopped Prolia at the end of 2019 (but she is not completely sure about this) as it was difficult for her to travel from [place] to [place] due to her needing to take care of her husband, who has dementia.  Of note, she did not continue with any other antiresorptive after she stopped Prolia. - we reviewed her dietary and supplemental calcium and vitamin D intake.  On questioning, she takes 10,000 units of vitamin D daily, which seems like a very high dose.  I will check her vitamin D today to make sure that she is not taking too much - discussed fall precautions   - given handout from [facility] [facility]: weight bearing exercises - advised to do this every day or at least 5/7 days - I also recommended the [facility]  center - for skeletal loading. Given brochure and explained benefits. - we discussed about maintaining a good amount of protein in her diet. The recommended daily protein intake is ~0.8 g per kilogram per day (approximately 50 g daily for her). I advised her to try to aim for this amount, since a  diet low in proteins can exacerbate osteoporosis. Also, avoid smoking or >2 drinks of alcohol a day. - I recommended the following book for the concept of low acid eating -explained that plant-based proteins are preferred:  - We discussed about the different medication classes, benefits and side effects (including atypical fractures and ONJ - no dental workup in progress or planned).  - I explained that first options are to repeat sq denosumab (Prolia) sq every 6 months for 6-10 years and zoledronic acid (Reclast) iv 1x a year for 1-2 years. I would use Teriparatide/Abaloparatide (which are daily subcu medication) or Romosozumab (monthly) as a last resort.  At today's visit, she would like to restart Prolia. I explained the mechanism of action and expected benefits.  -Check a BMP today (since we are applying for Prolia) along with a vitamin D - if labs normal, will arrange for a Prolia inj - will check a new DXA scan in 2 years after starting Prolia -  I explained that the first indication that the treatment is working is her not having anymore fractures. DXA scan changes are secondary: unchanged or slightly higher T-scores are desirable - will see pt back in a year, but sooner for Prolia injections  Component     Latest Ref Rng & Units 01/10/2021  Vitamin D, 25-Hydroxy     30.0 - 100.0 ng/mL 59.5  Interestingly, vitamin D is normal >> will continue the current vit D supplement dose. Will submit her to the Prolia portal.  Philemon Kingdom, MD PhD [facility]

## 2021-01-10 NOTE — Patient Instructions (Addendum)
Please stop at the lab.  Please stop vitamin D until I let you know about the dose.  Please come back for a follow-up appointment in 1 year.  How Can I Prevent Falls? Men and women with osteoporosis need to take care not to fall down. Falls can break bones. Some reasons people fall are: Poor vision  Poor balance  Certain diseases that affect how you walk  Some types of medicine, such as sleeping pills.  Some tips to help prevent falls outdoors are: Use a cane or walker  Wear rubber-soled shoes so you don't slip  Walk on grass when sidewalks are slippery  In winter, put salt or kitty litter on icy sidewalks.  Some ways to help prevent falls indoors are: Keep rooms free of clutter, especially on floors  Use plastic or carpet runners on slippery floors  Wear low-heeled shoes that provide good support  Do not walk in socks, stockings, or slippers  Be sure carpets and area rugs have skid-proof backs or are tacked to the floor  Be sure stairs are well lit and have rails on both sides  Put grab bars on bathroom walls near tub, shower, and toilet  Use a rubber bath mat in the shower or tub  Keep a flashlight next to your bed  Use a sturdy step stool with a handrail and wide steps  Add more lights in rooms (and night lights) Buy a cordless phone to keep with you so that you don't have to rush to the phone       when it rings and so that you can call for help if you fall.   (adapted from http://[email or web address].NightlifePreviews.se)  Please check out the following book about best diet for bone health:   Exercise for Strong Bones (from [place]) There are two types of exercises that are important for building and maintaining bone density:  weight-bearing and muscle-strengthening exercises. Weight-bearing Exercises These exercises include activities that make you move against gravity while staying upright. Weight-bearing exercises can  be high-impact or low-impact. High-impact weight-bearing exercises help build bones and keep them strong. If you have broken a bone due to osteoporosis or are at risk of breaking a bone, you may need to avoid high-impact exercises. If you're not sure, you should check with your healthcare provider. Examples of high-impact weight-bearing exercises are: . Dancing . Doing high-impact aerobics . Hiking . Jogging/running . Jumping Rope . Stair climbing . Tennis Low-impact weight-bearing exercises can also help keep bones strong and are a safe alternative if you cannot do high-impact exercises. Examples of low-impact weight-bearing exercises are: . Using elliptical training machines . Doing low-impact aerobics . Using stair-step machines . Fast walking on a treadmill or outside Muscle-Strengthening Exercises These exercises include activities where you move your body, a weight or some other resistance against gravity. They are also known as resistance exercises and include: . Lifting weights . Using elastic exercise bands . Using weight machines . Lifting your own body weight . Functional movements, such as standing and rising up on your toes Yoga and Pilates can also improve strength, balance and flexibility. However, certain positions may not be safe for people with osteoporosis or those at increased risk of broken bones. For example, exercises that have you bend forward may increase the chance of breaking a bone in the spine. A physical therapist should be able to help you learn which exercises are safe and appropriate for you. Non-Impact Exercises Non-impact exercises can help you  to improve balance, posture and how well you move in everyday activities. These exercises can also help to increase muscle strength and decrease the risk of falls and broken bones. Some of these exercises include: . Balance exercises that strengthen your legs and test your balance, such as Tai Chi, can decrease your  risk of falls. . Posture exercises that improve your posture and reduce rounded or "sloping" shoulders can help you decrease the chance of breaking a bone, especially in the spine. . Functional exercises that improve how well you move can help you with everyday activities and decrease your chance of falling and breaking a bone. For example, if you have trouble getting up from a chair or climbing stairs, you should do these activities as exercises. A physical therapist can teach you balance, posture and functional exercises. Starting a New Exercise Program If you haven't exercised regularly for a while, check with your healthcare provider before beginning a new exercise program--particularly if you have health problems such as heart disease, diabetes or high blood pressure. If you're at high risk of breaking a bone, you should work with a physical therapist to develop a safe exercise program. Once you have your healthcare provider's approval, start slowly. If you've already broken bones in the spine because of osteoporosis, be very careful to avoid activities that require reaching down, bending forward, rapid twisting motions, heavy lifting and those that increase your chance of a fall. As you get started, your muscles may feel sore for a day or two after you exercise. If soreness lasts longer, you may be working too hard and need to ease up. Exercises should be done in a pain-free range of motion. How Much Exercise Do You Need? Weight-bearing exercises 30 minutes on most days of the week. Do a 30-minutesession or multiple sessions spread out throughout the day. The benefits to your bones are the same.   Muscle-strengthening exercises Two to three days per week. If you don't have much time for strengthening/resistance training, do small amounts at a time. You can do just one body part each day. For example do arms one day, legs the next and trunk the next. You can also spread these exercises out during your  normal day.  Balance, posture and functional exercises Every day or as often as needed. You may want to focus on one area more than the others. If you have fallen or lose your balance, spend time doing balance exercises. If you are getting rounded shoulders, work more on posture exercises. If you have trouble climbing stairs or getting up from the couch, do more functional exercises. You can also perform these exercises at one time or spread them during your day. Work with a phyiscal therapist to learn the right exercises for you.

## 2021-01-11 LAB — VITAMIN D 25 HYDROXY (VIT D DEFICIENCY, FRACTURES): Vit D, 25-Hydroxy: 59.5 ng/mL (ref 30.0–100.0)

## 2021-01-17 ENCOUNTER — Ambulatory Visit: Payer: Medicare Other | Admitting: Neurology

## 2021-01-24 ENCOUNTER — Ambulatory Visit
Admission: RE | Admit: 2021-01-24 | Discharge: 2021-01-24 | Disposition: A | Discharge status: Home / Self Care | Payer: Medicare Other | Source: Ambulatory Visit | Attending: Family Medicine | Admitting: Family Medicine

## 2021-01-24 ENCOUNTER — Other Ambulatory Visit: Payer: Self-pay

## 2021-03-16 ENCOUNTER — Telehealth: Payer: Self-pay

## 2021-03-16 NOTE — Telephone Encounter (Signed)
Can you please submit her to the Prolia portal?  Thank you! C

## 2021-03-16 NOTE — Telephone Encounter (Signed)
Prolia VOB initiated via [email or web address] 

## 2021-03-16 NOTE — Telephone Encounter (Signed)
Pt ready for scheduling  Out-of-pocket cost due at time of visit: $290  Prolia co-insurance: 20% ($255) Admin fee: $35  No Prior Auth required  Deductible does not apply          MEDICAL BENEFIT SUMMARY Patient Out-of-Pocket Responsibility Coverage Available Authorization Required Deductible Co-pay/Coinsurance Prolia OOP COST PHYSICIAN FACILITY FEE ADMIN FEE PURCHASE OR REFERRAL: YES No PRIMARY No SECONDARY No 20%* No* $35* SPECIALTY PHARMACY (via Medical Benefit): YES No 20%* No* $35* *Reflects patient costs once plan deductible is met. Please see Medical Benefit Details for further information regarding patient costs. Patient costs may vary based on services rendered. BENEFITS VERIFIED FOR THE FOLLOWING DIAGNOSIS AND INSURANCE PLANS Verified for Diagnosis M81.0 Site of Care  MD Office   Policy Level: Primary Policy Status: Active Payer Name: [place] Name: [place] (HMO-POS) Policy Number: [id number] Employer Name: Plan Type: Group Number: [id number]  Payer Phone: [phone] PRIMARY MEDICAL BENEFIT DETAILS (PHYSICIAN PURCHASE, [place]) COVERAGE AVAILABLE: Yes COVERAGE DETAILS: Prolia is subject to a 20.0% coinsurance and $4,500.00 out of pocket max ($105.00 met). Once met, Prolia will then be covered at 100%. The benefits provided on this Verification of Benefits form are Medical Benefits and are the patient's In-Network benefits for Prolia. If you would like Pharmacy Benefits for Prolia, please call [phone]. AUTHORIZATION REQUIRED: No

## 2021-03-17 NOTE — Telephone Encounter (Signed)
Called to schedule prolia appt, pt was shocked at the out of pocket price she has to pay. She is wondering why it costs so much when last time she had it it wasn't so expensive. Pt is wondering if there is anything to do to make it cheaper, like a coupon or if picking it up at the drug store will reduce the cost?   Pt wants to hold off on scheduling until she hears back from [place] regarding this. Ph# [phone]

## 2021-03-21 MED ORDER — DENOSUMAB 60 MG/ML ~~LOC~~ SOSY: 60.0000 mg | PREFILLED_SYRINGE | Freq: Once | SUBCUTANEOUS | 0 refills | Status: AC

## 2021-03-21 NOTE — Addendum Note (Signed)
Addended by: Jasper Loser on: 03/21/2021 05:55 AM   Modules accepted: Orders

## 2021-03-21 NOTE — Telephone Encounter (Signed)
I am happy to send the prescription into her local pharmacy if she would like to check to see what the cost would be.   Unfortunately Medicare insurance plans are not eligible to participate in the Prolia Co-pay assistance program.   Rx sent to [facility] in [place].

## 2021-03-21 NOTE — Telephone Encounter (Signed)
Spoke with pt to let her know the prescription was sent to the [facility] in [place] for her to compare the prices and decide what route she is going to take regarding her prolia. Pt says she will call back to schedule her nurse visit once she decides.

## 2021-03-31 ENCOUNTER — Other Ambulatory Visit: Payer: Self-pay | Admitting: Family Medicine

## 2021-04-03 ENCOUNTER — Telehealth: Payer: Self-pay

## 2021-04-03 MED ORDER — ROSUVASTATIN CALCIUM 5 MG PO TABS: 5.0000 mg | ORAL_TABLET | Freq: Every day | ORAL | 3 refills | Status: DC

## 2021-04-03 NOTE — Telephone Encounter (Signed)
I spoke with pt. She will go back on the Crestor 5 mg and let us know if she develops any symptoms.

## 2021-04-03 NOTE — Addendum Note (Signed)
Addended by: Rodrigo Ran on: 04/03/2021 02:33 PM   Modules accepted: Orders

## 2021-04-03 NOTE — Telephone Encounter (Signed)
Patient called in about taking her statin medication. She is having cramping in her hands & joints; itchiness all over her body. Pt was advised to stop the medication & message will be sent to pcp for an alternative. Pt is currently on pravastatin 20 mg tablet taking 1/2 tablet daily.

## 2021-04-03 NOTE — Telephone Encounter (Signed)
She was on Crestor 5 mg before, which last visit she reported as well tolerated.So she could try it again,otherwise she can continue just with low fat diet and we can re-check next visit. Thanks, BJ

## 2021-04-10 NOTE — Progress Notes (Deleted)
Virtual Visit via Video Note The purpose of this virtual visit is to provide medical care while limiting exposure to the novel coronavirus.    Consent was obtained for video visit:  yes Answered questions that patient had about telehealth interaction:  yes I discussed the limitations, risks, security and privacy concerns of performing an evaluation and management service by telemedicine. I also discussed with the patient that there may be a patient responsible charge related to this service. The patient expressed understanding and agreed to proceed.  Pt location: Home Physician Location: office Name of referring provider:  Martinique, Betty G, MD I connected with Rolla Plate at patients initiation/request on 04/11/2021 at  2:30 PM EDT by video enabled telemedicine application and verified that I am speaking with the correct person using two identifiers. 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  1.  Zonisamide 200mg  daily 2.  *** 3.  ***  History of Present Illness:  Markeisha S. Signer is a 80 year oldright-handed white female with hyperlipidemia, TMJ dysfunction, thyroid disease and remote history of uterine cancerwho follows up for seizures.  UPDATE: Current medications:  Oxcarbazepine 300mg  BID  Stopped zonidamide because she said it caused tachycardia, problems with breathing, leg pain, stomach upset and difficulty sleeping.  In November, she was switched to oxcarbazepine.  Na level from January was 139.  HISTORY: On 05/20/2019,she was sleeping in bed when her husband heard change in breathing. He was unable to wake her up and he couldn't. No associated shaking, incontinence or tongue biting. EMS was contacted and she was brought to [facility]. In the ED, she appeared confused and did not remember the episode. CBC, electrolytes,  troponin and EKG were unremarkable. She was admitted for further evaluation. She had an apnea link performed which did not show any episodes of apnea but her O2 saturation dropped below 80% several times. CT chest showed questionable atelectasis versus infiltrate in upper lobes. She did not exhibit any fever or upper respiratory symptoms but was started on empiric IV antibiotics and Lasix. She demonstrated no further nocturnal hypoxia. CT of head was negative for acute intracranial abnormality. 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 No definitive diagnosis was established. She was discharged with home oxygen and recommendation for outpatient sleep study and neurology follow up.Routine EEG on 10/19/2019 showed intermittent left temporal sharply-contoured slowing but no clear epileptiform discharges. A 24 hour ambulatory EEG from 10/28/2019 to 10/29/2019 was normal.   She was admitted to [facility] on 06/19/2020 after she was found down by her husband unresponsive and not breathing. He started CPR and 911 was called. She was awake once EMS arrived. MRI of brain was limited due to motion artifact but chronic small vessel ischemic changes noted and no acute abnormality.EEG was unremarkable.  Started on Keppra.  She was off AED for several weeks when she had recurrent seizure-like spell.  On 09/12/2020, she was eating dinner with family when she started playing with her food and was aphasic.  She was admitted to [facility].  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  EEG was normal.   Past medications:  Keppra (side effects),  Vimpat, zonisamide (side effects)  Past Medical History: Past Medical History:  Diagnosis Date  .  Allergy   . Arthritis    hands  . Blood transfusion without reported diagnosis 1963   with child birth  . Cancer [facility])    uterine cancer - age 62   . Cataract    small, watching   . GERD (gastroesophageal reflux disease)   . Heart murmur    with pregnancy- none noted after pregnancies   . Hyperlipidemia   . Osteopenia    gets prolia every 6 months   . Stroke [facility])    TIA x 3 per pt- 05-2019 last episode - per note in care every where/ neuro note not CVA but old infarts noted on testing   . Thyroid disease     Medications: Outpatient Encounter Medications as of 04/11/2021  Medication Sig  . Ascorbic Acid (VITAMIN C) 1000 MG tablet Take 1,000 mg by mouth daily.   [name] aspirin 81 MG chewable tablet Chew 81 mg by mouth daily.   . Cholecalciferol (VITAMIN D3) 400 units CAPS Take 400 Units by mouth daily.   . Cyanocobalamin (VITAMIN B 12 PO) Take 1,000 mg by mouth daily.  . Glucosamine-Chondroitin (GLUCOSAMINE CHONDR COMPLEX PO) Take 1 tablet by mouth at bedtime.  . levETIRAcetam (KEPPRA) 500 MG tablet Take 500 mg by mouth 2 (two) times daily.  [name] levothyroxine (SYNTHROID) 50 MCG tablet Take 1 tablet (50 mcg total) by mouth daily.  . OXcarbazepine (TRILEPTAL) 150 MG tablet Take 1 tablet (150 mg total) by mouth 2 (two) times daily. Take  150mg  twice daily for one week, then 300mg  twice daily.  . rosuvastatin (CRESTOR) 5 MG tablet Take 1 tablet (5 mg total) by mouth daily.  . vitamin E 400 UNIT capsule Take 400 Units by mouth daily.    No facility-administered encounter medications on file as of 04/11/2021.    Allergies: No Known Allergies  Family History: Family History  Problem Relation Age of Onset  . Colon polyps Son   . Colon cancer Neg Hx   . Rectal cancer Neg Hx   . Stomach cancer Neg Hx   . Pancreatic cancer Neg Hx   . Esophageal cancer Neg Hx     Observations/Objective:   *** No acute distress.  Alert and oriented.  Speech fluent and not dysarthric.  Language intact.   Eyes orthophoric on primary gaze.  Face symmetric.   Follow Up Instructions:    -I discussed the assessment and treatment plan with the patient. The patient was provided an opportunity to ask questions and all were answered. The patient agreed with the plan and demonstrated an understanding of the instructions.   The patient was advised to call back or seek an in-person evaluation if the symptoms worsen or if the condition fails to improve as anticipated.   Dudley Major, DO

## 2021-04-11 ENCOUNTER — Ambulatory Visit: Payer: Medicare Other | Admitting: Neurology

## 2021-04-26 NOTE — Progress Notes (Signed)
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  1.  Keppra 500mg  twice daily.   2.  Follow up 6 months.  Subjective:  Allison Roberson is a 80 year oldright-handed white female with hyperlipidemia, TMJ dysfunction, thyroid disease and remote history of uterine cancerwho follows up for seizures.  UPDATE: Current medications:  Keppra 500mg  twice daily.  Last seizure:  September 2021  Stopped zonidamide because she said it caused tachycardia, problems with breathing, leg pain, stomach upset and difficulty sleeping.  In November, she was switched to oxcarbazepine.  Na level from January was 139. However, she appears to be taking Keppra at this time.  We contacted her pharmacy and apparently she has had refills of the Keppra and has been taking the Elliott again.  This time, she has been tolerating it.  She only took the oxcarbazepine for one month.  HISTORY: On 05/20/2019,she was sleeping in bed when her husband heard change in breathing. He was unable to wake her up and he couldn't. No associated shaking, incontinence or tongue biting. EMS was contacted and she was brought to [facility]. In the ED, she appeared confused and did not remember the episode. CBC, electrolytes, troponin and EKG were unremarkable. She was admitted for further evaluation. She had an apnea link performed which did not show any episodes of apnea but her O2 saturation dropped below 80% several times. CT chest showed questionable atelectasis versus infiltrate in upper lobes. She did not exhibit any fever or upper respiratory symptoms but was started on empiric IV antibiotics and Lasix. She demonstrated no further nocturnal hypoxia. CT of head was negative for acute intracranial abnormality. 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 CTA of head and neck demonstrated  fibromuscular dysplasia involving the carotid arteries without occlusion or significant stenosis. No definitive diagnosis was established. She was discharged with home oxygen and recommendation for outpatient sleep study and neurology follow up.Routine EEG on 10/19/2019 showed intermittent left temporal sharply-contoured slowing but no clear epileptiform discharges. A 24 hour ambulatory EEG from 10/28/2019 to 10/29/2019 was normal.   She was admitted to [facility] on 06/19/2020 after she was found down by her husband unresponsive and not breathing. He started CPR and 911 was called. She was awake once EMS arrived. MRI of brain was limited due to motion artifact but chronic small vessel ischemic changes noted and no acute abnormality.EEG was unremarkable.  Started on Keppra.  She was off AED for several weeks when she had recurrent seizure-like spell.  On 09/12/2020, she was eating dinner with family when she started playing with her food and was aphasic.  She was admitted to [facility].  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  EEG was normal.   Past medications:  Vimpat, zonisamide (side effects)   PAST MEDICAL HISTORY: Past Medical History:  Diagnosis Date  . Allergy   . Arthritis    hands  . Blood transfusion without reported diagnosis 1963   with child birth  . Cancer [facility])    uterine cancer - age 80   . Cataract    small, watching   . GERD (gastroesophageal reflux disease)   . Heart murmur    with pregnancy- none noted after pregnancies   . Hyperlipidemia   . Osteopenia    gets  prolia every 6 months   . Stroke [facility])    TIA x 3 per pt- 05-2019 last episode - per note in care every where/ neuro note not CVA but old infarts noted on testing   . Thyroid disease     MEDICATIONS: Current Outpatient Medications on File Prior to Visit  Medication Sig  Dispense Refill  . Ascorbic Acid (VITAMIN C) 1000 MG tablet Take 1,000 mg by mouth daily.     [name] aspirin 81 MG chewable tablet Chew 81 mg by mouth daily.     . Cholecalciferol (VITAMIN D3) 400 units CAPS Take 400 Units by mouth daily.     . Cyanocobalamin (VITAMIN B 12 PO) Take 1,000 mg by mouth daily.    . Glucosamine-Chondroitin (GLUCOSAMINE CHONDR COMPLEX PO) Take 1 tablet by mouth at bedtime.    . levETIRAcetam (KEPPRA) 500 MG tablet Take 500 mg by mouth 2 (two) times daily.    [name] levothyroxine (SYNTHROID) 50 MCG tablet Take 1 tablet (50 mcg total) by mouth daily. 90 tablet 3  . OXcarbazepine (TRILEPTAL) 150 MG tablet Take 1 tablet (150 mg total) by mouth 2 (two) times daily. Take  150mg  twice daily for one week, then 300mg  twice daily. 90 tablet 3  . rosuvastatin (CRESTOR) 5 MG tablet Take 1 tablet (5 mg total) by mouth daily. 30 tablet 3  . vitamin E 400 UNIT capsule Take 400 Units by mouth daily.      No current facility-administered medications on file prior to visit.    ALLERGIES: No Known Allergies  FAMILY HISTORY: Family History  Problem Relation Age of Onset  . Colon polyps Son   . Colon cancer Neg Hx   . Rectal cancer Neg Hx   . Stomach cancer Neg Hx   . Pancreatic cancer Neg Hx   . Esophageal cancer Neg Hx       Objective:  Blood pressure (!) 148/82, pulse 77, height 4\' 11"  (1.499 m), weight 115 lb (52.2 kg), SpO2 97 %. General: No acute distress.  Patient appears well-groomed.   Head:  Normocephalic/atraumatic Eyes:  Fundi examined but not visualized Neck: supple, no paraspinal tenderness, full range of motion Heart:  Regular rate and rhythm Lungs:  Clear to auscultation bilaterally Back: No paraspinal tenderness Neurological Exam: alert and oriented to person, place, and time. Speech fluent and not dysarthric, language intact.  CN II-XII intact. Bulk and tone normal, muscle strength 5/5 throughout.  Sensation to light touch  intact.  Deep tendon reflexes 2+  throughout.  Finger to nose testing intact.  Gait normal, Romberg negative.     Metta Clines, DO  CC: Betty Martinique, MD

## 2021-04-28 ENCOUNTER — Encounter: Payer: Self-pay | Admitting: Neurology

## 2021-04-28 ENCOUNTER — Ambulatory Visit: Payer: Medicare Other | Admitting: Neurology

## 2021-04-28 ENCOUNTER — Other Ambulatory Visit: Payer: Self-pay

## 2021-04-28 ENCOUNTER — Telehealth: Payer: Self-pay

## 2021-04-28 VITALS — BP 148/82 | HR 77 | Ht 59.0 in | Wt 115.0 lb

## 2021-04-28 DIAGNOSIS — G40909 Epilepsy, unspecified, not intractable, without status epilepticus: Secondary | ICD-10-CM | POA: Diagnosis not present

## 2021-04-28 NOTE — Telephone Encounter (Signed)
Need to call pharmacy on Monday 05/01/21

## 2021-04-28 NOTE — Patient Instructions (Signed)
Continue levetiracetam 500mg  twice daily Follow up in 6 months.

## 2021-04-28 NOTE — Telephone Encounter (Signed)
Need to call Pharmacy and ask who prescribes patients Keppra. Called Pharmacy at 3:20pm and was on hold and call was disconnected.   [id number] [address], [place] AT [address] [facility] [address]  [place], [place] [address]  Phone:  [phone] Fax:  [phone]

## 2021-05-01 ENCOUNTER — Telehealth: Payer: Self-pay

## 2021-05-01 ENCOUNTER — Encounter: Payer: Self-pay | Admitting: Neurology

## 2021-05-01 NOTE — Telephone Encounter (Signed)
Per Dr.Jaffe call to confirm Prescriptions.   Pt start Oxcarbazepine script given 10/2020,and keppra 07/12/20 with 5 refills.  Pt never picked up oxcarbazepine after 10/2020., but picked keppra 12/2020,02/2021,03/2021.   Per Dr.Jaffe cancelled the Oxcarbazepine and to refill Keppra 500 mg Bid with 5 refill.

## 2021-05-28 NOTE — Telephone Encounter (Signed)
Please follow up with pt regarding Prolia inj.   Thanks!

## 2021-05-29 NOTE — Telephone Encounter (Signed)
Spoke to pt regarding scheduling prolia appt. Pt states the cost is too much for her right now and if there is no other way for her to get the cost reduced then she will have to pass. Pt states she will call the pharmacy and see if they can give her a estimated price and if it is any cheaper she might proceed. Pt will call the office back to report what she is going to do.

## 2021-07-04 NOTE — Telephone Encounter (Signed)
Pt archived in [email or web address].  Please advise if patient and/or provider wish to proceed with Prolia therpay.  

## 2021-07-08 IMAGING — MR MR HEAD WO/W CM
15 of 19 series · 35 of 48 positions shown · IV contrast (gadavist)
Comparison: Noncontrast head CT performed earlier the same day
06/19/2020.

CLINICAL DATA: Seizure, nontraumatic.

EXAM:
MRI HEAD WITHOUT AND WITH CONTRAST
TECHNIQUE: Multiplanar, multiecho pulse sequences of the brain and surrounding
structures were obtained without and with intravenous contrast.
CONTRAST:  5mL GADAVIST GADOBUTROL 1 MMOL/ML IV SOLN

[Series 5: DWI · axial · 3.0mm · 0.88mm/px · z∈[-111,+28]mm · 6 of 96 slices shown (1 of 4)]
[im 1/96]
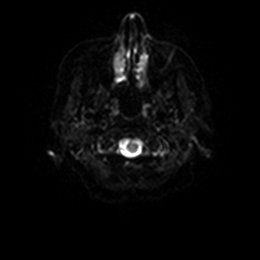
[im 20/96]
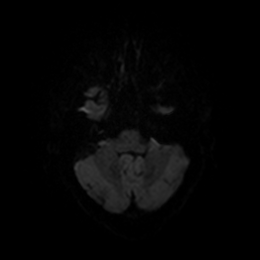
[im 39/96]
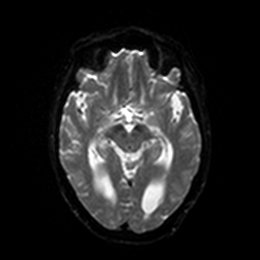
[im 58/96]
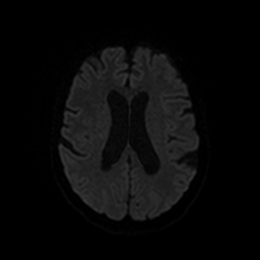
[im 77/96]
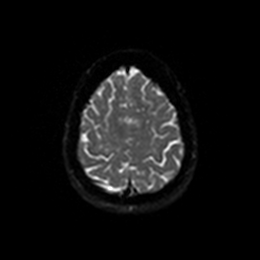
[im 96/96]
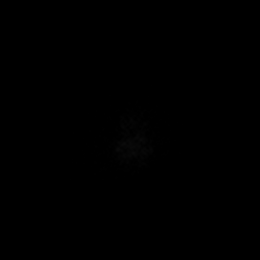

[Series 6: DWI · axial · 3.0mm · 0.88mm/px · z∈[-111,+28]mm · 3 of 48 slices shown (2 of 4)]
[im 1/48]
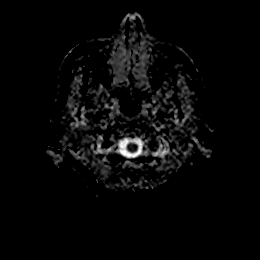
[im 24/48]
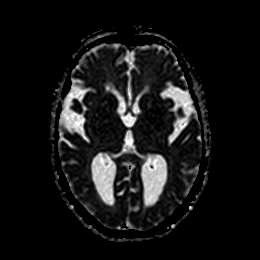
[im 48/48]
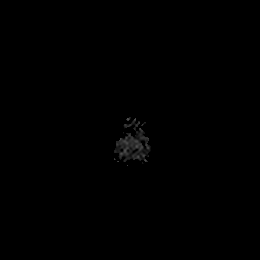

[Series 7: DWI · coronal · 4.0mm · 0.88mm/px · 4 of 70 slices shown (3 of 4)]
[im 1/70]
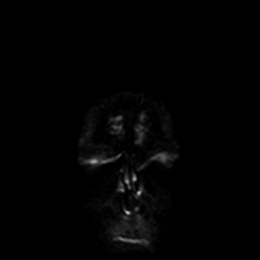
[im 24/70]
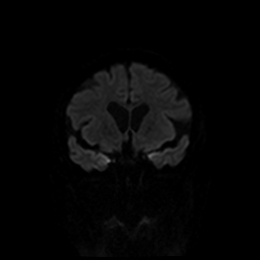
[im 47/70]
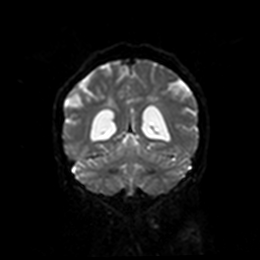
[im 70/70]
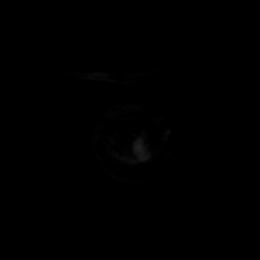

[Series 8: DWI · coronal · 4.0mm · 0.88mm/px · 2 of 35 slices shown (4 of 4)]
[im 1/35]
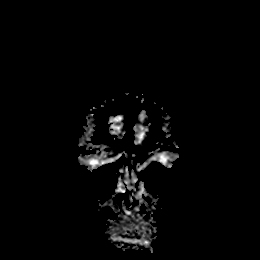
[im 35/35]
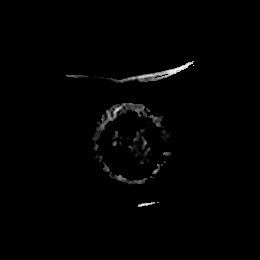

[Series 9: T1 · sagittal · 5.0mm · 0.75mm/px · 1 of 23 slices shown]
[im 1/23]
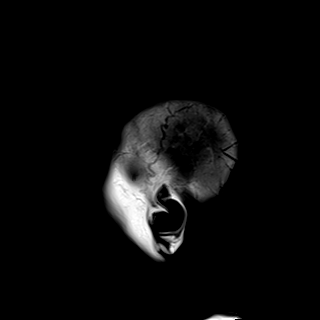

[Series 10: T2 · axial · 5.0mm · 0.72mm/px · z∈[-116,+26]mm · 2 of 25 slices shown (1 of 3)]
[im 1/25]
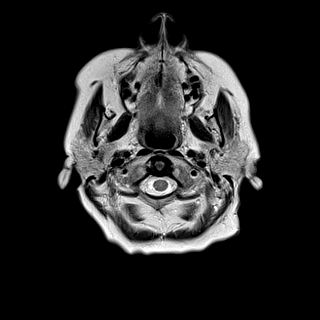
[im 25/25]
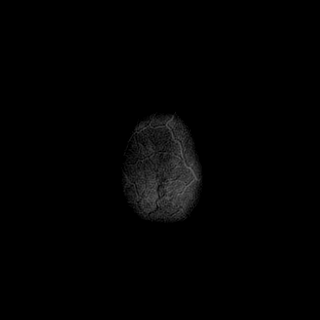

[Series 11: FLAIR · axial · 5.0mm · 0.45mm/px · z∈[-118,+24]mm · 2 of 25 slices shown (1 of 2)]
[im 1/25]
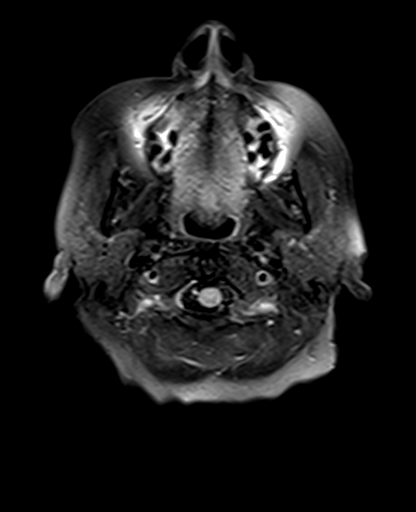
[im 25/25]
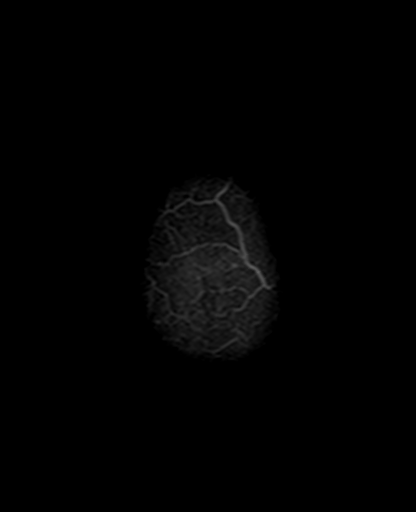

[Series 12: mag_images · axial · 3.0mm · 0.90mm/px · z∈[-116,+23]mm · 3 of 48 slices shown]
[im 1/48]
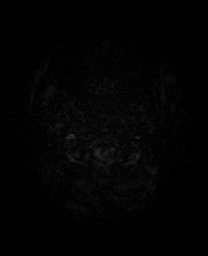
[im 24/48]
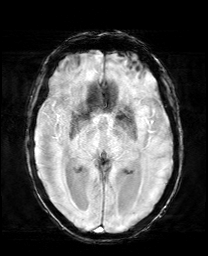
[im 48/48]
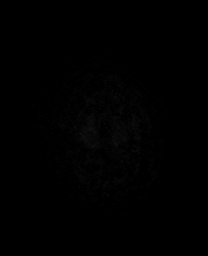

[Series 13: pha_images · axial · 3.0mm · 0.90mm/px · 1 of 47 slices shown]
[im 1/47]
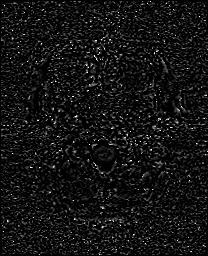

[Series 17: T2 · coronal · 3.0mm · 0.27mm/px · 2 of 35 slices shown (2 of 3)]
[im 1/35]
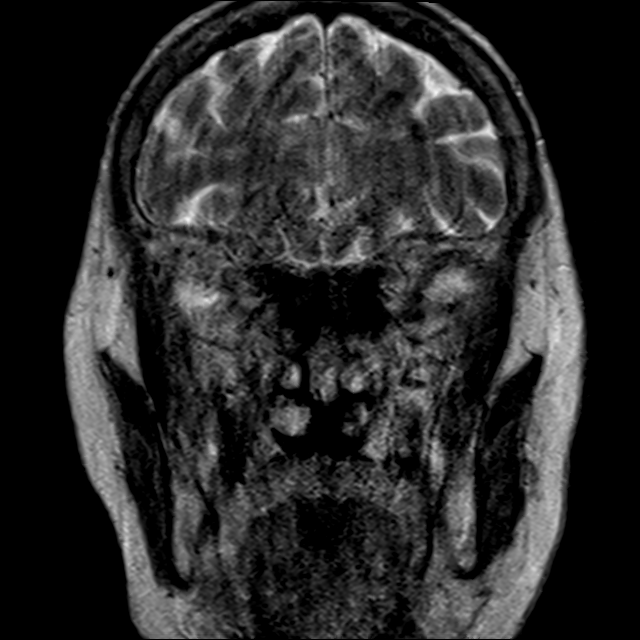
[im 35/35]
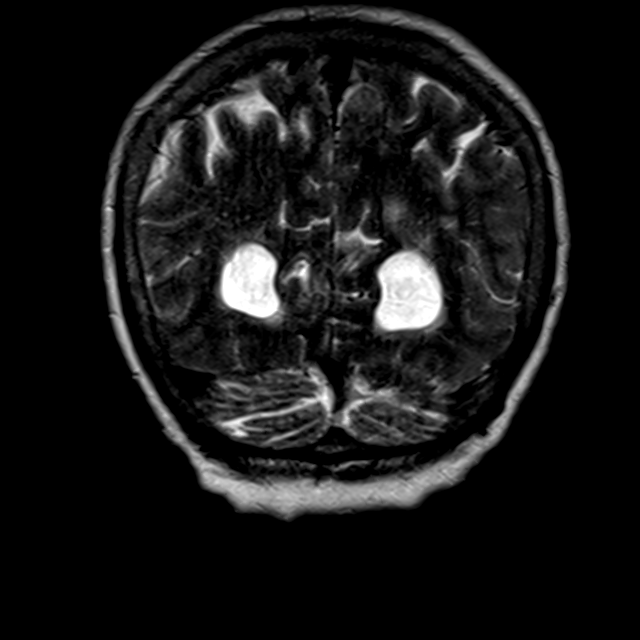

[Series 18: T2 · coronal · 3.0mm · 0.27mm/px · 2 of 35 slices shown (3 of 3)]
[im 1/35]
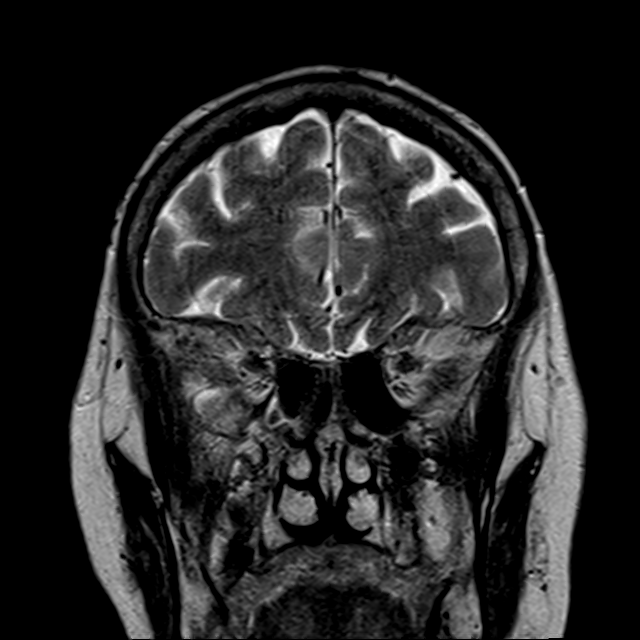
[im 35/35]
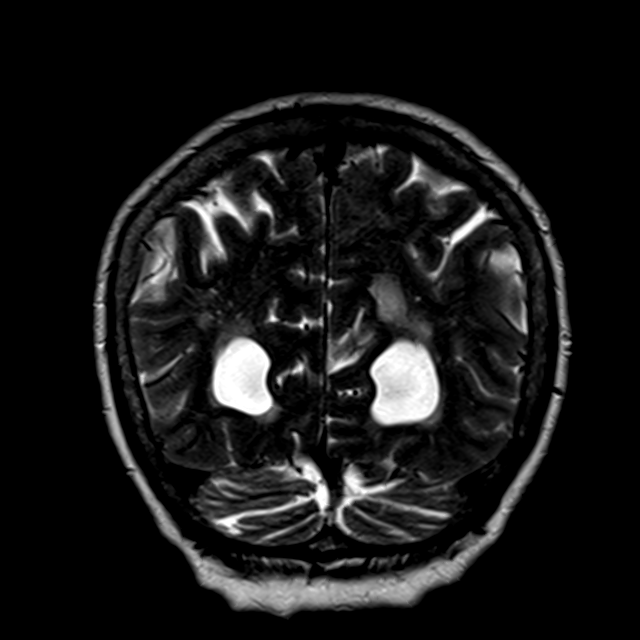

[Series 19: FLAIR · coronal · 3.0mm · 0.56mm/px · 2 of 35 slices shown (2 of 2)]
[im 1/35]
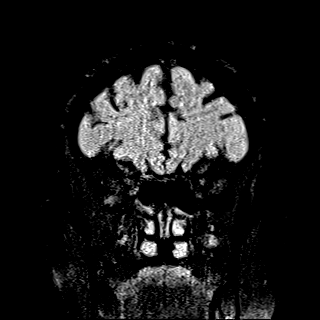
[im 35/35]
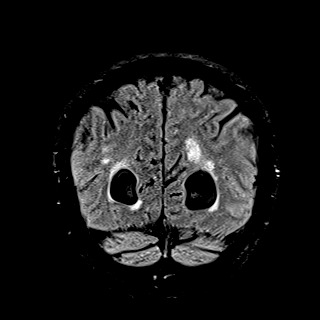

[Series 20: T2 post-contrast · coronal · 5.0mm · 0.72mm/px · 2 of 30 slices shown]
[im 1/30]
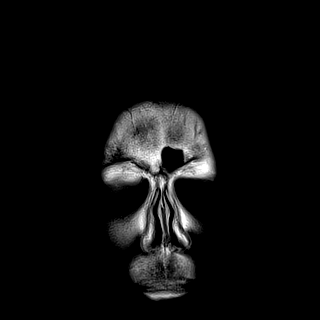
[im 30/30]
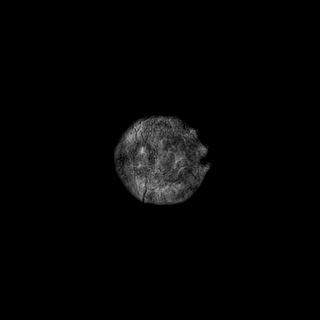

[Series 22: T1 post-contrast · coronal · 5.0mm · 0.34mm/px · 2 of 30 slices shown (1 of 2)]
[im 1/30]
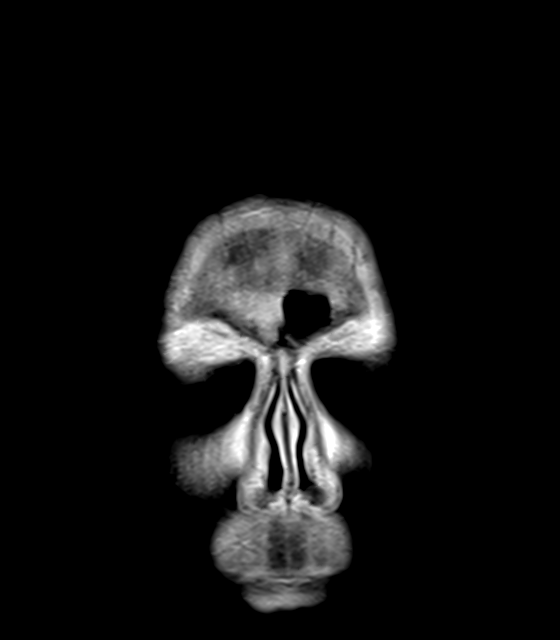
[im 30/30]
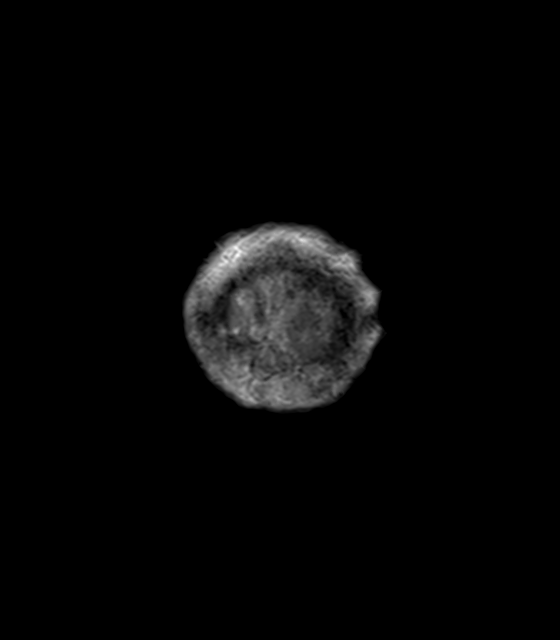

[Series 23: T1 post-contrast · sagittal · 5.0mm · 0.72mm/px · 1 of 23 slices shown (2 of 2)]
[im 1/23]
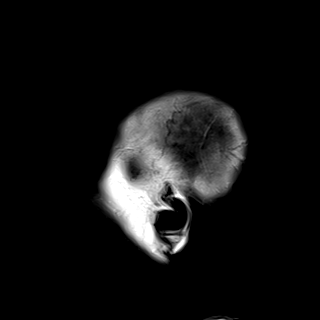

[35 of 48 positions shown; findings below may reference images not displayed]

FINDINGS: Brain:

Multiple sequences are motion degraded limiting evaluation. Most
notably, there is moderate to moderately severe motion degradation
of the T1 weighted postcontrast sequences.

Mild generalized parenchymal atrophy.

Scattered small chronic lacunar infarcts within the cerebral white
matter and pons. Background mild patchy T2/FLAIR hyperintensity
within the cerebral white matter is nonspecific, but consistent with
chronic small vessel ischemic disease.

Additionally, there are multiple chronic infarcts within the
cerebellum, the largest on the right measuring 12 mm.

The hippocampi appear symmetric in size and signal.

There is no evidence of acute infarct.

No evidence of intracranial mass.

No chronic intracranial blood products.

No extra-axial fluid collection.

No midline shift.

Within limitations of significant motion degradation, no abnormal
intracranial enhancement is identified.

Vascular: Expected proximal arterial flow voids.

Skull and upper cervical spine: No focal marrow lesion.

Sinuses/Orbits: Visualized orbits show no acute finding. Mild
ethmoid sinus mucosal thickening. No significant mastoid effusion
IMPRESSION: Motion degraded examination as described. This includes moderate to
moderately severe motion degradation of the T1 weighted postcontrast
imaging.

Within this limitation, there is no evidence of acute intracranial
abnormality.

No specific seizure focus is identified.

Small chronic infarcts within the cerebral white matter and
cerebellum. Background mild chronic small vessel ischemic disease.

Mild ethmoid sinus mucosal thickening.

## 2021-07-31 ENCOUNTER — Other Ambulatory Visit: Payer: Self-pay | Admitting: Family Medicine

## 2021-09-10 ENCOUNTER — Telehealth: Payer: Self-pay

## 2021-09-10 NOTE — Telephone Encounter (Signed)
Called and spoke to patient. Per patient, not interested in scheduling AWV. Sw, cma

## 2021-11-03 NOTE — Progress Notes (Signed)
NEUROLOGY FOLLOW UP OFFICE NOTE  [name] [id number]  Assessment/Plan:   Seizure-like events, possible focal seizures with impaired awareness.  Stable.   Keppra 500mg  twice daily Follow up one year  Subjective:  Allison Roberson is a 80 year old right-handed white female with hyperlipidemia, TMJ dysfunction, thyroid disease and remote history of uterine cancer who follows up for seizures.   UPDATE: Current medications:  Keppra 500mg  twice daily.   Last seizure:  September 2021 She reports cramping and itching since starting Keppra, but not significant enough that she would want to change.[name]     HISTORY: On 05/20/2019, she was sleeping in bed when her husband heard change in breathing.  He was unable to wake her up and he couldn't.  No associated shaking, incontinence or tongue biting.  EMS was contacted and she was brought to [facility].  In the ED, she appeared confused and did not remember the episode.  CBC, electrolytes, troponin and EKG were unremarkable.  She was admitted for further evaluation.  She had an apnea link performed which did not show any episodes of apnea but her O2 saturation dropped below 80% several times.  CT chest showed questionable atelectasis versus infiltrate in upper lobes.  She did not exhibit any fever or upper respiratory symptoms but was started on empiric IV antibiotics and Lasix.  She demonstrated no further nocturnal hypoxia.  CT of head was negative for acute intracranial abnormality.  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  No definitive diagnosis was established.  She was discharged with home oxygen and recommendation for outpatient sleep study and neurology follow up.  Routine EEG on 10/19/2019 showed intermittent left temporal sharply-contoured slowing but no clear  epileptiform discharges.  A 24 hour ambulatory EEG from 10/28/2019 to 10/29/2019 was normal.    She was admitted to [facility] on 06/19/2020 after she was found down by her husband unresponsive and not breathing.  He started CPR and 911 was called.  She was awake once EMS arrived.  MRI of brain was limited due to motion artifact but chronic small vessel ischemic changes noted and no acute abnormality.  EEG was unremarkable.  Started on Keppra.    She was off AED for several weeks when she had recurrent seizure-like spell.  On 09/12/2020, she was eating dinner with family when she started playing with her food and was aphasic.  She was [facility] to [facility].  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  EEG was normal.    Past medications:  Vimpat, oxcarbazepine, zonisamide (side effects)  PAST MEDICAL HISTORY: Past Medical History:  Diagnosis Date   Allergy    Arthritis    hands   Blood transfusion without reported diagnosis 1963   with child birth   Cancer [facility])    uterine cancer - age 7    Cataract    small, watching    GERD (gastroesophageal reflux disease)    Heart murmur    with pregnancy- none noted after pregnancies    Hyperlipidemia    Osteopenia    gets prolia every 6 months    Stroke [facility])    TIA x 3 per pt- 05-2019 last episode - per note in care every where/ neuro note not  CVA but old infarts noted on testing    Thyroid disease     MEDICATIONS: Current Outpatient Medications on File Prior to Visit  Medication Sig Dispense Refill   Ascorbic Acid (VITAMIN C) 1000 MG tablet Take 1,000 mg by mouth daily.      aspirin 81 MG chewable tablet Chew 81 mg by mouth daily.      Cholecalciferol (VITAMIN D3) 400 units CAPS Take 400 Units by mouth daily.      Cyanocobalamin (VITAMIN B 12 PO) Take 1,000 mg by mouth daily.     Glucosamine-Chondroitin (GLUCOSAMINE  CHONDR COMPLEX PO) Take 1 tablet by mouth at bedtime.     levETIRAcetam (KEPPRA) 500 MG tablet Take 500 mg by mouth 2 (two) times daily.     levothyroxine (SYNTHROID) 50 MCG tablet Take 1 tablet (50 mcg total) by mouth daily. 90 tablet 3   OXcarbazepine (TRILEPTAL) 150 MG tablet Take 1 tablet (150 mg total) by mouth 2 (two) times daily. Take  150mg  twice daily for one week, then 300mg  twice daily. (Patient not taking: Reported on 04/28/2021) 90 tablet 3   rosuvastatin (CRESTOR) 5 MG tablet TAKE 1 TABLET(5 MG) BY MOUTH DAILY 30 tablet 3   vitamin E 400 UNIT capsule Take 400 Units by mouth daily.      No current facility-administered medications on file prior to visit.    ALLERGIES: No Known Allergies  FAMILY HISTORY: Family History  Problem Relation Age of Onset   Colon polyps Son    Colon cancer Neg Hx    Rectal cancer Neg Hx    Stomach cancer Neg Hx    Pancreatic cancer Neg Hx    Esophageal cancer Neg Hx       Objective:  Blood pressure (!) 151/79, pulse 77, height 4\' 11"  (1.499 m), weight 122 lb 6.4 oz (55.5 kg), SpO2 97 %. General: No acute distress.  Patient appears well-groomed.   Head:  Normocephalic/atraumatic Eyes:  Fundi examined but not visualized Neck: supple, no paraspinal tenderness, full range of motion Heart:  Regular rate and rhythm Lungs:  Clear to auscultation bilaterally Back: No paraspinal tenderness Neurological Exam: alert and oriented to person, place, and time.  Speech fluent and not dysarthric, language intact.  CN II-XII intact. Bulk and tone normal, muscle strength 5/5 throughout.  Sensation to light touch intact.  Deep tendon reflexes 2+ throughout.  Finger to nose testing intact.  Gait normal, Romberg negative.   Metta Clines, DO  CC: Allison Martinique, MD

## 2021-11-06 ENCOUNTER — Ambulatory Visit: Payer: Medicare Other | Admitting: Neurology

## 2021-11-06 ENCOUNTER — Encounter: Payer: Self-pay | Admitting: Neurology

## 2021-11-06 ENCOUNTER — Other Ambulatory Visit: Payer: Self-pay

## 2021-11-06 VITALS — BP 151/79 | HR 77 | Ht 59.0 in | Wt 122.4 lb

## 2021-11-06 DIAGNOSIS — R569 Unspecified convulsions: Secondary | ICD-10-CM

## 2021-11-06 MED ORDER — LEVETIRACETAM 500 MG PO TABS: 500.0000 mg | ORAL_TABLET | Freq: Two times a day (BID) | ORAL | 3 refills | Status: DC

## 2021-11-06 NOTE — Patient Instructions (Signed)
Continue levetiracetam 500mg  twice daily

## 2021-11-28 ENCOUNTER — Other Ambulatory Visit: Payer: Self-pay | Admitting: Family Medicine

## 2021-12-21 ENCOUNTER — Other Ambulatory Visit: Payer: Self-pay | Admitting: Family Medicine

## 2022-01-11 ENCOUNTER — Ambulatory Visit: Payer: Medicare Other | Admitting: Internal Medicine

## 2022-01-11 ENCOUNTER — Encounter: Payer: Self-pay | Admitting: Internal Medicine

## 2022-01-11 ENCOUNTER — Other Ambulatory Visit: Payer: Self-pay

## 2022-01-11 VITALS — BP 118/82 | HR 79 | Ht 59.0 in | Wt 118.2 lb

## 2022-01-11 DIAGNOSIS — E039 Hypothyroidism, unspecified: Secondary | ICD-10-CM | POA: Diagnosis not present

## 2022-01-11 DIAGNOSIS — M810 Age-related osteoporosis without current pathological fracture: Secondary | ICD-10-CM | POA: Diagnosis not present

## 2022-01-11 DIAGNOSIS — E673 Hypervitaminosis D: Secondary | ICD-10-CM

## 2022-01-11 LAB — BASIC METABOLIC PANEL
BUN: 13 mg/dL (ref 6–23)
CO2: 30 mEq/L (ref 19–32)
Calcium: 10.1 mg/dL (ref 8.4–10.5)
Chloride: 102 mEq/L (ref 96–112)
Creatinine, Ser: 0.79 mg/dL (ref 0.40–1.20)
GFR: 71.67 mL/min (ref >60.00)
Glucose, Bld: 87 mg/dL (ref 70–99)
Potassium: 3.9 mEq/L (ref 3.5–5.1)
Sodium: 142 mEq/L (ref 135–145)

## 2022-01-11 LAB — TSH: TSH: 2.5 u[IU]/mL (ref 0.35–5.50)

## 2022-01-11 LAB — VITAMIN D 25 HYDROXY (VIT D DEFICIENCY, FRACTURES): VITD: >120 ng/mL

## 2022-01-11 NOTE — Patient Instructions (Addendum)
Please stop at the lab.  Please continue the same dose of vitamin D.  We will try to get Prolia again but we should try Reclast if Prolia is still too expensive.  Please come back for a follow-up appointment in 1 year.

## 2022-01-11 NOTE — Progress Notes (Signed)
Patient ID: Allison Roberson, female   DOB: Mar 12, 1941, 81 y.o.   MRN: [id number]  This visit occurred during the SARS-CoV-2 public health emergency.  Safety protocols were in place, including screening questions prior to the visit, additional usage of staff PPE, and extensive cleaning of exam room while observing appropriate contact time as indicated for disinfecting solutions.   HPI  Allison Roberson is a 81 y.o.-year-old female, initially referred by her PCP, Dr. Martinique, returning for follow-up for osteoporosis.  Last visit 1 year ago.  Interim history:  No fractures or falls since last visit. She denies dizziness/vertigo/orthostasis/poor vision.  She had a lot of stress lately with her husband having dementia and her daughter recently diagnosed with kidney failure.  Reviewed and addended history: Pt was dx with osteoporosis in 11/2020.  I reviewed pt's available DXA scan report: Date L1-L4 T score FN T score 33% distal Radius  [facility]) n/a RFN: -2.3 LFN: -1.9 -4.4  11/29/2014 ([facility]) -0.8 RFN: n/a LFN: -1.9 n/a  09/24/2012 ([facility]) -1.3 RFN: n/a LFN: -2.3 n/a   Previous OP treatments:  -Bisphosphonates per chart - pt. does not recall -Prolia x 5.5 years - last inj. 2019 -At last visit, I suggested to start Prolia again, but this was too expensive.  No h/o vitamin D deficiency. Reviewed available vit D levels: Lab Results  Component Value Date   VD25OH 59.5 01/10/2021   VD25OH 58.13 12/09/2019   VD25OH 58.02 02/17/2018   Pt is on: - vitamin D 10,000 units daily Stopped MVI per neurology advice.  No weight bearing exercises.  She uses an exercise bike at home.  She does not take high vitamin A doses.  No smoking, no alcohol use, no history of steroid use.  No RA.  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  Unknown family history of osteoporosis. Grew up separated from her family.  No h/o  persistent hyper/hypocalcemia or hyperparathyroidism. No h/o kidney stones. Lab Results  Component Value Date   PTH 24 02/17/2018   CALCIUM 9.7 01/10/2021   CALCIUM 9.6 06/19/2020   CALCIUM 10.9 (H) 12/09/2019   CALCIUM 10.4 02/17/2018   She also has hypothyroidism:  She is on LT4 50 mcg daily: - in am - fasting - at least 30 min from b'fast if she eats b'fast at all - + calcium at night - no iron - no multivitamins - no PPIs - not on Biotin  Reviewed TSH recent levels:  Lab Results  Component Value Date   TSH 2.56 12/13/2020   TSH 1.83 12/09/2019   TSH 2.35 02/17/2018   No h/o CKD. 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  Last seizure 08/2020. She also has a history of hiatal hernia per swallow study.  Husband has dementia.  ROS: + see HPI  + dysphagia + itching   I reviewed pt's medications, allergies, PMH, social hx, family hx, and changes were documented in the history of present illness. Otherwise, unchanged from my initial visit note.  Past Medical History:  Diagnosis Date   Allergy    Arthritis    hands   Blood transfusion without reported diagnosis 1963   with child birth   Cancer [facility])    uterine cancer - age 60    Cataract  small, watching    GERD (gastroesophageal reflux disease)    Heart murmur    with pregnancy- none noted after pregnancies    Hyperlipidemia    Osteopenia    gets prolia every 6 months    Stroke [facility])    TIA x 3 per pt- 05-2019 last episode - per note in care every where/ neuro note not CVA but old infarts noted on testing    Thyroid disease    Past Surgical History:  Procedure Laterality Date   ABDOMINAL HYSTERECTOMY  1969   COLONOSCOPY  08/05/2006   tic, moderate    PARTIAL HYSTERECTOMY     has ovaries   POLYPECTOMY     Social History   Socioeconomic History   Marital status: Married     Spouse name: Not on file   Number of children: 2   Years of education: Not on file   Highest education level: High school graduate  Occupational History   Not on file  Tobacco Use   Smoking status: Never   Smokeless tobacco: Never  Vaping Use   Vaping Use: Never used  Substance and Sexual Activity   Alcohol use: No   Drug use: No   Sexual activity: Never  Other Topics Concern   Not on file  Social History Narrative   Social Review      Patient lives at home with.SPOUSE      Pt lives in a one or two story home? 2 STORY HOME      Does patient lives in a facility? If so where? PRIVATE HOME      What is patient highest level of education? HIGH SCHOOL GRADUATE      Is patient RIGHT or LEFT handed? RIGHT HANDED      Does patient drink coffee, tea, soda? YES      How much coffee, tea, soda does patient drink? SODA SOMETIMES, COFFEE ONCE A WEEK      Does patient exercise regularly? YES SHE HAS EXERCISE BIKE IN HER HOME      Drinks one coke every 2 days    Lives in a 2 story home   Social Determinants of Health   Financial Resource Strain: Not on file  Food Insecurity: Not on file  Transportation Needs: Not on file  Physical Activity: Not on file  Stress: Not on file  Social Connections: Not on file  Intimate Partner Violence: Not on file   Current Outpatient Medications on File Prior to Visit  Medication Sig Dispense Refill   Ascorbic Acid (VITAMIN C) 1000 MG tablet Take 1,000 mg by mouth daily.      aspirin 81 MG chewable tablet Chew 81 mg by mouth daily.      Cholecalciferol (VITAMIN D3) 400 units CAPS Take 400 Units by mouth daily.      Cyanocobalamin (VITAMIN B 12 PO) Take 1,000 mg by mouth daily.     Glucosamine-Chondroitin (GLUCOSAMINE CHONDR COMPLEX PO) Take 1 tablet by mouth at bedtime.     levETIRAcetam (KEPPRA) 500 MG tablet Take 1 tablet (500 mg total) by mouth 2 (two) times daily. 180 tablet 3   levothyroxine (SYNTHROID) 50 MCG tablet TAKE 1 TABLET(50  MCG) BY MOUTH DAILY 90 tablet 0   OXcarbazepine (TRILEPTAL) 150 MG tablet Take 1 tablet (150 mg total) by mouth 2 (two) times daily. Take  150mg  twice daily for one week, then 300mg  twice daily. (Patient not taking: Reported on 04/28/2021) 90 tablet 3   rosuvastatin (CRESTOR) 5  MG tablet TAKE 1 TABLET(5 MG) BY MOUTH DAILY 90 tablet 0   vitamin E 400 UNIT capsule Take 400 Units by mouth daily.      No current facility-administered medications on file prior to visit.   No Known Allergies Family History  Problem Relation Age of Onset   Colon polyps Son    Colon cancer Neg Hx    Rectal cancer Neg Hx    Stomach cancer Neg Hx    Pancreatic cancer Neg Hx    Esophageal cancer Neg Hx     PE: BP 118/82 (BP Location: Right Arm, Patient Position: Sitting, Cuff Size: Normal)    Pulse 79    Ht 4\' 11"  (1.499 m)    Wt 118 lb 3.2 oz (53.6 kg)    SpO2 95%    BMI 23.87 kg/m  Wt Readings from Last 3 Encounters:  01/11/22 118 lb 3.2 oz (53.6 kg)  11/06/21 122 lb 6.4 oz (55.5 kg)  04/28/21 115 lb (52.2 kg)   Constitutional: Normal weight, in NAD. No kyphosis. Eyes: PERRLA, EOMI, no exophthalmos ENT: moist mucous membranes, no thyromegaly, no cervical lymphadenopathy Cardiovascular: RRR, No MRG, + slight periankle swelling in the left Respiratory: CTA B Musculoskeletal: no deformities, strength intact in all 4 Skin: moist, warm, no rashes Neurological: no tremor with outstretched hands, DTR normal in all 4  Assessment: 1. Osteoporosis  2. Hypothyroidism  Plan: 1. Osteoporosis -Likely postmenopausal/age-related -We discussed about having higher risk for fracture if the T score is lower than -2.5.  Reviewed the latest 3 bone density scans together and I explained that based on the T-scores she has an increased risk of fractures.  Her lowest T score is at the 73% distal radius per review of the report from 2021.  I do not have the full previous reports , but there has been significant improvement in  her bone density between 2013 and 2015, most likely due to being on Prolia at that time.  She describes that she stopped Prolia at the end of 2019 (but she was not completely sure about this) and it was difficult for her to travel from [place] to [employer] to have the injection as she needed to take care of her husband, who had dementia.  She also did not continue with other antiresorptive medications after Prolia.  At last visit, I suggested to start this again and she agreed with this, but unfortunately she had a high co-pay and could not proceed with it. -At last visit, vitamin D level was normal so we continued her vitamin D supplement of 10,000 units daily.  We will repeat this today. -We again discussed fall precautions. -At last visit I recommended weightbearing exercises 5 out of 7 days-given National osteoporosis foundation recommendations -I also recommended [facility] for skeletal loading.  I gave her a brochure and explained the benefits.  She did not start this as she does not leave in [place] and was also very busy since last visit.  At that time we also discussed about best diet for osteoporosis. -At today's visit, we will check another BMP and vitamin D level.  -After the above results returned, we plan to re-apply for Prolia, but I advised her to let me know if we cannot start this, in which case, we should start Reclast.  I explained how this is different than Prolia and also the schedule.  We most likely can continue with this for 3 to 5 years and then take a drug  holiday. -She is due for another bone density scan at the end of this year.  We will order one at next visit. -I will see her back in 1 year  2. Hypothyroidism - latest thyroid labs reviewed with pt. >> normal: Lab Results  Component Value Date   TSH 2.56 12/13/2020  - she continues on LT4 50 mcg daily - pt feels good on this dose. - we discussed about taking the thyroid hormone every day, with water,  >30 minutes before breakfast, separated by >4 hours from acid reflux medications, calcium, iron, multivitamins. Pt. is taking it correctly. - will check thyroid tests today: TSH  - If labs are abnormal, she will need to return for repeat TFTs in 1.5 months  Component     Latest Ref Rng & Units 01/11/2022          Sodium     135 - 145 mEq/L 142  Potassium     3.5 - 5.1 mEq/L 3.9  Chloride     96 - 112 mEq/L 102  CO2     19 - 32 mEq/L 30  Glucose     70 - 99 mg/dL 87  BUN     6 - 23 mg/dL 13  Creatinine     0.40 - 1.20 mg/dL 0.79  Calcium     8.4 - 10.5 mg/dL 10.1  GFR     >60.00 mL/min 71.67  TSH     0.35 - 5.50 uIU/mL 2.50  VITD     ng/mL >120   Vitamin D is undetectably high.  We will stop vitamin D for now and recheck the level in 2 months.  The rest of the labs are normal.  Philemon Kingdom, MD PhD [facility]

## 2022-02-26 ENCOUNTER — Other Ambulatory Visit: Payer: Self-pay | Admitting: Family Medicine

## 2022-03-13 ENCOUNTER — Other Ambulatory Visit: Payer: Medicare Other

## 2022-03-21 ENCOUNTER — Other Ambulatory Visit: Payer: Self-pay | Admitting: Family Medicine

## 2022-04-13 ENCOUNTER — Other Ambulatory Visit: Payer: Self-pay | Admitting: Family Medicine

## 2022-04-14 ENCOUNTER — Other Ambulatory Visit: Payer: Self-pay | Admitting: Family Medicine

## 2022-05-21 ENCOUNTER — Other Ambulatory Visit: Payer: Self-pay | Admitting: Family Medicine

## 2022-05-26 ENCOUNTER — Other Ambulatory Visit: Payer: Self-pay | Admitting: Family Medicine

## 2022-05-30 ENCOUNTER — Other Ambulatory Visit: Payer: Self-pay | Admitting: Family Medicine

## 2022-06-08 ENCOUNTER — Other Ambulatory Visit: Payer: Self-pay | Admitting: Family Medicine

## 2022-09-07 NOTE — Progress Notes (Unsigned)
HPI: [name] is a 81 y.o. female, who is here today to follow on medication. She was last seen on 12/13/20. Since her last visit she has seen neurologist. She has not had episodes of seizure since Levetiracetam was started, she is on 500 mg bid. Osteoporosis: Follows with Dr Cruzita Lederer.  Hyperlipidemia: Currently on Rosuvastatin 5 mg daily. Side effects from medication:None. She is trying to follow a healthful diet, has been helping with her daughter's care, who has ESRD and on dialysis.  Lab Results  Component Value Date   CHOL 197 12/13/2020   HDL 69.80 12/13/2020   LDLCALC 92 12/13/2020   LDLDIRECT 166.0 12/09/2019   TRIG 176.0 (H) 12/13/2020   CHOLHDL 3 12/13/2020   Hypothyroidism: She is on Levothyroxine 50 mcg daily. Tolerating medication well, no side effects reported. Negative for dysphagia, palpitations, abdominal pain, changes in bowel habits, tremor, cold/heat intolerance, or abnormal weight loss.  Lab Results  Component Value Date   TSH 2.50 01/11/2022   Review of Systems  Constitutional:  Negative for activity change, appetite change and fever.  HENT:  Negative for mouth sores, nosebleeds and trouble swallowing.   Respiratory:  Negative for cough and wheezing.   Gastrointestinal:  Negative for abdominal pain, nausea and vomiting.       Negative for changes in bowel habits.  Genitourinary:  Negative for decreased urine volume, difficulty urinating, dysuria and hematuria.  Neurological:  Negative for seizures, syncope, facial asymmetry and weakness.  Rest see pertinent positives and negatives per HPI.  Current Outpatient Medications on File Prior to Visit  Medication Sig Dispense Refill   Ascorbic Acid (VITAMIN C) 1000 MG tablet Take 1,000 mg by mouth daily.      aspirin 81 MG chewable tablet Chew 81 mg by mouth daily.      Cholecalciferol (VITAMIN D3) 400 units CAPS Take 400 Units by mouth daily.      Cyanocobalamin (VITAMIN B 12 PO) Take 500 mg  by mouth daily.     levETIRAcetam (KEPPRA) 500 MG tablet Take 1 tablet (500 mg total) by mouth 2 (two) times daily. 180 tablet 3   rosuvastatin (CRESTOR) 5 MG tablet TAKE 1 TABLET(5 MG) BY MOUTH DAILY 30 tablet 0   vitamin E 400 UNIT capsule Take 400 Units by mouth daily.      No current facility-administered medications on file prior to visit.   Past Medical History:  Diagnosis Date   Allergy    Arthritis    hands   Blood transfusion without reported diagnosis 1963   with child birth   Cancer [facility])    uterine cancer - age 29    Cataract    small, watching    GERD (gastroesophageal reflux disease)    Heart murmur    with pregnancy- none noted after pregnancies    Hyperlipidemia    Osteopenia    gets prolia every 6 months    Stroke ([place])    TIA x 3 per pt- 05-2019 last episode - per note in care every where/ neuro note not CVA but old infarts noted on testing    Thyroid disease    No Known Allergies  Social History   Socioeconomic History   Marital status: Married    Spouse name: Not on file   Number of children: 2   Years of education: Not on file   Highest education level: High school graduate  Occupational History   Not on file  Tobacco Use   Smoking  status: Never   Smokeless tobacco: Never  Vaping Use   Vaping Use: Never used  Substance and Sexual Activity   Alcohol use: No   Drug use: No   Sexual activity: Never  Other Topics Concern   Not on file  Social History Narrative   Social Review      Patient lives at home with.SPOUSE      Pt lives in a one or two story home? 2 STORY HOME      Does patient lives in a facility? If so where? PRIVATE HOME      What is patient highest level of education? HIGH SCHOOL GRADUATE      Is patient RIGHT or LEFT handed? RIGHT HANDED      Does patient drink coffee, tea, soda? YES      How much coffee, tea, soda does patient drink? SODA SOMETIMES, COFFEE ONCE A WEEK      Does patient exercise regularly? YES SHE HAS  EXERCISE BIKE IN HER HOME      Drinks one coke every 2 days    Lives in a 2 story home   Social Determinants of Health   Financial Resource Strain: Not on file  Food Insecurity: Not on file  Transportation Needs: Not on file  Physical Activity: Not on file  Stress: Not on file  Social Connections: Not on file   Vitals:   09/10/22 1426  BP: 120/70  Pulse: 77  Resp: 16  Temp: 98.1 F (36.7 C)  SpO2: 94%   Body mass index is 24.04 kg/m.  Physical Exam Vitals and nursing note reviewed.  Constitutional:      General: She is not in acute distress.    Appearance: She is well-developed.  HENT:     Head: Normocephalic and atraumatic.     Mouth/Throat:     Mouth: Mucous membranes are moist.     Dentition: Has dentures.     Pharynx: Oropharynx is clear.  Eyes:     Conjunctiva/sclera: Conjunctivae normal.  Cardiovascular:     Rate and Rhythm: Normal rate and regular rhythm.     Pulses:          Dorsalis pedis pulses are 2+ on the right side and 2+ on the left side.     Heart sounds: No murmur heard. Pulmonary:     Effort: Pulmonary effort is normal. No respiratory distress.     Breath sounds: Normal breath sounds.  Abdominal:     Palpations: Abdomen is soft. There is no hepatomegaly or mass.     Tenderness: There is no abdominal tenderness.  Skin:    General: Skin is warm.     Findings: No erythema or rash.  Neurological:     General: No focal deficit present.     Mental Status: She is alert and oriented to person, place, and time.     Cranial Nerves: No cranial nerve deficit.     Gait: Gait normal.  Psychiatric:        Mood and Affect: Mood and affect normal.   ASSESSMENT AND PLAN:  [name] was seen today for follow-up.  Diagnoses and all orders for this visit:  Orders Placed This Encounter  Procedures   Flu Vaccine QUAD High Dose(Fluad)   Hepatic function panel   TSH   Lipid panel   Lab Results  Component Value Date   TSH 2.50 01/11/2022   Lab  Results  Component Value Date   ALT 16 01/10/2021   AST  18 01/10/2021   ALKPHOS 123 (H) 01/10/2021   BILITOT 0.5 01/10/2021   Lab Results  Component Value Date   CHOL 197 12/13/2020   HDL 69.80 12/13/2020   LDLCALC 92 12/13/2020   LDLDIRECT 166.0 12/09/2019   TRIG 176.0 (H) 12/13/2020   CHOLHDL 3 12/13/2020   Mixed hyperlipidemia Continue Rosuvastatin 5 mg daily and low fat diet. Further recommendations according to lipid panel result.  Acquired hypothyroidism Problem has been well controlled. Continue Levothyroxine 50 mcg daily. Further recommendations according to TSH result.  Seizure ([place]) Problem has been well controlled. Following with neurologist.  Need for influenza vaccination -     Flu Vaccine QUAD High Dose(Fluad)  Return in about 1 year (around 09/11/2023) for CPE.  Faven Watterson G. Martinique, MD  [facility]. [place] office.

## 2022-09-10 ENCOUNTER — Encounter: Payer: Self-pay | Admitting: Family Medicine

## 2022-09-10 ENCOUNTER — Ambulatory Visit (INDEPENDENT_AMBULATORY_CARE_PROVIDER_SITE_OTHER): Payer: Medicare Other | Admitting: Family Medicine

## 2022-09-10 VITALS — BP 120/70 | HR 77 | Temp 98.1°F | Resp 16 | Ht 59.0 in | Wt 119.0 lb

## 2022-09-10 DIAGNOSIS — R569 Unspecified convulsions: Secondary | ICD-10-CM

## 2022-09-10 DIAGNOSIS — E782 Mixed hyperlipidemia: Secondary | ICD-10-CM | POA: Diagnosis not present

## 2022-09-10 DIAGNOSIS — E039 Hypothyroidism, unspecified: Secondary | ICD-10-CM

## 2022-09-10 DIAGNOSIS — Z23 Encounter for immunization: Secondary | ICD-10-CM

## 2022-09-10 MED ORDER — LEVOTHYROXINE SODIUM 50 MCG PO TABS: ORAL_TABLET | ORAL | 3 refills | Status: DC

## 2022-09-10 NOTE — Patient Instructions (Addendum)
A few things to remember from today's visit:  Mixed hyperlipidemia - Plan: TSH  Acquired hypothyroidism - Plan: Hepatic function panel, Lipid panel  Need for influenza vaccination - Plan: Flu Vaccine QUAD High Dose(Fluad)  If you need refills for medications you take chronically, please call your pharmacy. Do not use My Chart to request refills or for acute issues that need immediate attention. If you send a my chart message, it may take a few days to be addressed, specially if I am not in the office.  No changes today.  Please be sure medication list is accurate. If a new problem present, please set up appointment sooner than planned today.

## 2022-09-10 NOTE — Assessment & Plan Note (Signed)
Problem has been well controlled. Resume Levothyroxine 50 mcg daily. Further recommendations according to TSH result.

## 2022-09-10 NOTE — Assessment & Plan Note (Signed)
Ideally LDL goal < 70 given her hx of CVD. Continue Rosuvastatin 5 mg daily and low fat diet. Further recommendations according to lipid panel result.

## 2022-09-10 NOTE — Assessment & Plan Note (Signed)
Problem has been well controlled. Following with neurologist.

## 2022-09-11 LAB — HEPATIC FUNCTION PANEL
ALT: 15 U/L (ref 0–35)
AST: 18 U/L (ref 0–37)
Albumin: 4.5 g/dL (ref 3.5–5.2)
Alkaline Phosphatase: 75 U/L (ref 39–117)
Bilirubin, Direct: 0.1 mg/dL (ref 0.0–0.3)
Total Bilirubin: 0.7 mg/dL (ref 0.2–1.2)
Total Protein: 7.9 g/dL (ref 6.0–8.3)

## 2022-09-11 LAB — LDL CHOLESTEROL, DIRECT: Direct LDL: 127 mg/dL

## 2022-09-11 LAB — TSH: TSH: 7.25 u[IU]/mL — ABNORMAL HIGH (ref 0.35–5.50)

## 2022-09-11 LAB — LIPID PANEL
Cholesterol: 215 mg/dL — ABNORMAL HIGH (ref 0–200)
HDL: 53.8 mg/dL (ref >39.00)
NonHDL: 161.5
Total CHOL/HDL Ratio: 4
Triglycerides: 273 mg/dL — ABNORMAL HIGH (ref 0.0–149.0)
VLDL: 54.6 mg/dL — ABNORMAL HIGH (ref 0.0–40.0)

## 2022-09-12 ENCOUNTER — Other Ambulatory Visit: Payer: Self-pay | Admitting: Family Medicine

## 2022-10-04 ENCOUNTER — Other Ambulatory Visit: Payer: Self-pay | Admitting: Neurology

## 2022-10-04 DIAGNOSIS — R569 Unspecified convulsions: Secondary | ICD-10-CM

## 2022-10-29 NOTE — Progress Notes (Signed)
This encounter was created in error - please disregard.

## 2022-11-02 NOTE — Progress Notes (Deleted)
NEUROLOGY FOLLOW UP OFFICE NOTE  Allison Roberson [id number]  Assessment/Plan:   Seizure-like events, possible focal seizures with impaired awareness.  Stable.   Keppra '500mg'$  twice daily Follow up one year  Subjective:  Allison Roberson is a 81 year old right-handed white female with hyperlipidemia, TMJ dysfunction, thyroid disease and remote history of uterine cancer who follows up for seizures.   UPDATE: Current medications:  Keppra '500mg'$  twice daily.   Last seizure:  September 2021 ***    HISTORY: On 05/20/2019, she was sleeping in bed when her husband heard change in breathing.  He was unable to wake her up and he couldn't.  No associated shaking, incontinence or tongue biting.  EMS was contacted and she was brought to [facility].  In the ED, she appeared confused and did not remember the episode.  CBC, electrolytes, troponin and EKG were unremarkable.  She was admitted for further evaluation.  She had an apnea link performed which did not show any episodes of apnea but her O2 saturation dropped below 80% several times.  CT chest showed questionable atelectasis versus infiltrate in upper lobes.  She did not exhibit any fever or upper respiratory symptoms but was started on empiric IV antibiotics and Lasix.  She demonstrated no further nocturnal hypoxia.  CT of head was negative for acute intracranial abnormality.  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  No definitive diagnosis was established.  She was discharged with home oxygen and recommendation for outpatient sleep study and neurology follow up.  Routine EEG on 10/19/2019 showed intermittent left temporal sharply-contoured slowing but no clear epileptiform discharges.  A 24 hour ambulatory EEG from 10/28/2019 to 10/29/2019 was normal.    She was admitted to  [facility] on 06/19/2020 after she was found down by her husband unresponsive and not breathing.  He started CPR and 911 was called.  She was awake once EMS arrived.  MRI of brain was limited due to motion artifact but chronic small vessel ischemic changes noted and no acute abnormality.  EEG was unremarkable.  Started on Keppra.    She was off AED for several weeks when she had recurrent seizure-like spell.  On 09/12/2020, she was eating dinner with family when she started playing with her food and was aphasic.  She was admitted to [facility].  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  EEG was normal.    Past medications:  Vimpat, oxcarbazepine, zonisamide (side effects)  PAST MEDICAL HISTORY: Past Medical History:  Diagnosis Date   Allergy    Arthritis    hands   Blood transfusion without reported diagnosis 1963   with child birth   Cancer [facility])    uterine cancer - age 29    Cataract    small, watching    GERD (gastroesophageal reflux disease)    Heart murmur    with pregnancy- none noted after pregnancies    Hyperlipidemia    Osteopenia    gets prolia every 6 months    Stroke [facility])    TIA x 3 per pt- 05-2019 last episode - per note in care every where/ neuro note not CVA but old infarts noted on testing    Thyroid disease     MEDICATIONS: Current  Outpatient Medications on File Prior to Visit  Medication Sig Dispense Refill   Ascorbic Acid (VITAMIN C) 1000 MG tablet Take 1,000 mg by mouth daily.      aspirin 81 MG chewable tablet Chew 81 mg by mouth daily.      Cholecalciferol (VITAMIN D3) 400 units CAPS Take 400 Units by mouth daily.      Cyanocobalamin (VITAMIN B 12 PO) Take 1,000 mg by mouth daily.     Glucosamine-Chondroitin (GLUCOSAMINE CHONDR COMPLEX PO) Take 1 tablet by mouth at bedtime.     levETIRAcetam (KEPPRA) 500 MG tablet Take 500 mg by mouth 2  (two) times daily.     levothyroxine (SYNTHROID) 50 MCG tablet Take 1 tablet (50 mcg total) by mouth daily. 90 tablet 3   OXcarbazepine (TRILEPTAL) 150 MG tablet Take 1 tablet (150 mg total) by mouth 2 (two) times daily. Take  '150mg'$  twice daily for one week, then '300mg'$  twice daily. (Patient not taking: Reported on 04/28/2021) 90 tablet 3   rosuvastatin (CRESTOR) 5 MG tablet TAKE 1 TABLET(5 MG) BY MOUTH DAILY 30 tablet 3   vitamin E 400 UNIT capsule Take 400 Units by mouth daily.      No current facility-administered medications on file prior to visit.    ALLERGIES: No Known Allergies  FAMILY HISTORY: Family History  Problem Relation Age of Onset   Colon polyps Son    Colon cancer Neg Hx    Rectal cancer Neg Hx    Stomach cancer Neg Hx    Pancreatic cancer Neg Hx    Esophageal cancer Neg Hx       Objective:  *** General: No acute distress.  Patient appears well-groomed.   Head:  Normocephalic/atraumatic Eyes:  Fundi examined but not visualized Neck: supple, no paraspinal tenderness, full range of motion Heart:  Regular rate and rhythm Neurological Exam: ***   Metta Clines, DO  CC: Betty Martinique, MD

## 2022-11-05 ENCOUNTER — Encounter: Payer: Self-pay | Admitting: Neurology

## 2022-11-05 ENCOUNTER — Ambulatory Visit: Payer: Medicare Other | Admitting: Neurology

## 2022-11-05 DIAGNOSIS — Z029 Encounter for administrative examinations, unspecified: Secondary | ICD-10-CM

## 2022-12-20 ENCOUNTER — Telehealth: Payer: Self-pay

## 2022-12-20 NOTE — Telephone Encounter (Signed)
Transition Care Management Follow-up Telephone Call Date of discharge and from where: [place] [facility] 12/19/2022 How have you been since you were released from the hospital? better Any questions or concerns? Yes  Items Reviewed: Did the pt receive and understand the discharge instructions provided? Yes  Medications obtained and verified? Yes  Other? No  Any new allergies since your discharge? No  Dietary orders reviewed? Yes Do you have support at home? Yes   Home Care and Equipment/Supplies: Were home health services ordered? no If so, what is the name of the agency? N/a  Has the agency set up a time to come to the patient's home? no Were any new equipment or medical supplies ordered?  No What is the name of the medical supply agency? N/a Were you able to get the supplies/equipment? not applicable Do you have any questions related to the use of the equipment or supplies? No  Functional Questionnaire: (I = Independent and D = Dependent) ADLs: I  Bathing/Dressing- I  Meal Prep- I  Eating- I  Maintaining continence- I  Transferring/Ambulation- I  Managing Meds- I  Follow up appointments reviewed:  PCP Hospital f/u appt confirmed? Yes  Scheduled to see Dr Martinique on 12/24/2022 @ 3:30. [facility] f/u appt confirmed? No  Are transportation arrangements needed? No  If their condition worsens, is the pt aware to call PCP or go to the Emergency Dept.? Yes Was the patient provided with contact information for the PCP's office or ED? Yes Was to pt encouraged to call back with questions or concerns? Yes Juanda Crumble, LPN [place] Direct Dial [phone]

## 2022-12-21 NOTE — Progress Notes (Unsigned)
Chief Complaint  Patient presents with   Hospitalization Follow-up   HPI: Allison Roberson is a 82 y.o. female with past medical history significant for seizures, hypothyroidism, hyperlipidemia, and TIA here today to follow on recent hospital visit. Evaluated in the ED and admitted on 12/18/2022 due to episodes of seizure and altered mental status. She was discharged home on 12/19/22. TCM call on 12/20/22. She was having breakfast with her daughter that they sent off for evaluation, her daughter called EMS because she was "zoning out."  Allison Roberson denies having an episode of confusion and states that she tried to persuade her daughter from calling EMS but her daughter thought she may be having a seizure or stroke. She does not recall any loss of consciousness or focal weakness.  Negative for urine or bowel incontinence. Negative for recent illness however that time she had episode. Negative for headache, visual changes, CP, palpitations, dyspnea, or diaphoresis.  She feels like she is at her baseline. She is not driving.  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 She reports taking medication as prescribed but according to discharge summary she reported taking one half twice daily Christmas Day because she thought  medication was causing pruritic rash. Her next appointment with her neurologist is scheduled for March 12 th. Head CT code stroke and head and neck CTA: 1. Negative for large vessel occlusion.  Negative CT Perfusion.  2. 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 No associated dissection or stenosis.  3. But otherwise minimal atherosclerosis in the head and neck. No hemodynamically significant stenosis identified.  4. No acute intracranial abnormality by CT. Progressed chronic ischemic disease since 2021, especially in the cerebellum.  5. Nonspecific bilateral upper lung opacity, suspicious for chronic lung  disease and superimposed atelectasis.  6. Mild aortic arch atherosclerosis.   Brain MRI: No acute intracranial process.  No evidence of acute or subacute infarct. 12/18/22: TSH 4.0.  Hypothyroidism on levothyroxine 50 mcg daily. Magnesium 2.1. B12 719.  Blood work 12/19/22: HEMOGLOBIN A1C <5.7 % 6.2 High    Hyperlipidemia: Currently she is on rosuvastatin 5 mg daily, higher doses caused myalgias. CVA, she is on Aspirin 81 mg daily.   Ref Range & Units 5 d ago Comments  Total Cholesterol 25 - 199 MG/DL 173   Triglycerides 10 - 150 MG/DL 256 High    HDL Cholesterol 35 - 135 MG/DL 46   LDL Cholesterol Calculated 0 - 99 MG/DL 76   Total Chol / HDL Cholesterol <4.5 3.8   Non-HDL Cholesterol MG/DL 127  TARGET: <(LDL-C TARGET + 30)MG/DL  Coronary Heart Disease Risk Table       Ref Range & Units 5 d ago Comments  Sodium 135 - 146 MMOL/L 138   Potassium 3.5 - 5.3 MMOL/L 4.1 SLIGHT HEMOLYSIS  Chloride 98 - 110 MMOL/L 104   CO2 23 - 30 MMOL/L 25   BUN 8 - 24 MG/DL 10   Glucose 70 - 99 MG/DL 93 Patients taking eltrombopag at doses >/= 100 mg daily may show falsely elevated values of 10% or greater.  Creatinine 0.50 - 1.50 MG/DL 0.65   Calcium 8.5 - 10.5 MG/DL 9.1   Anion Gap 4 - 14 MMOL/L 9   Est. GFR >=60 ML/MIN/1.73 M*2 90     Ref Range & Units 5 d ago   WBC 4.4 - 11.0 x 10*3/uL 9.9  RBC 4.10 - 5.10 x 10*6/uL 5.22 High  Hemoglobin 12.3 - 15.3 G/DL 16.8 High   Hematocrit 35.9 - 44.6 % 49.4 High   MCV 80.0 - 96.0 FL 94.6  MCH 27.5 - 33.2 PG 32.1  MCHC 33.0 - 37.0 G/DL 34.0  RDW 12.3 - 17.0 % 13.0  Platelets 150 - 450 X 10*3/uL 162  MPV 6.8 - 10.2 FL 7.6   She reports having a pruritic erythematous rash on her abdomen, upper chest, and back since the beginning of December/2023. She has been applying Benadryl and Cortisone creams, but these have not provided relief. The patient denies any new medications prior to the onset of the rash and has no history of eczema.  Review of Systems   Constitutional:  Negative for activity change, appetite change, chills and fever.  HENT:  Negative for mouth sores, sore throat and trouble swallowing.   Respiratory:  Negative for cough, shortness of breath and wheezing.   Cardiovascular:  Negative for leg swelling.  Gastrointestinal:  Negative for abdominal pain, nausea and vomiting.  Endocrine: Negative for cold intolerance and heat intolerance.  Genitourinary:  Negative for decreased urine volume, dysuria and hematuria.  Musculoskeletal:  Positive for arthralgias. Negative for gait problem.  Allergic/Immunologic: Positive for environmental allergies.  Neurological:  Negative for syncope, facial asymmetry and weakness.  See other pertinent positives and negatives in HPI.  Current Outpatient Medications on File Prior to Visit  Medication Sig Dispense Refill   Ascorbic Acid (VITAMIN C) 1000 MG tablet Take 1,000 mg by mouth daily.      aspirin 81 MG chewable tablet Chew 81 mg by mouth daily.      Cholecalciferol (VITAMIN D3) 400 units CAPS Take 400 Units by mouth daily.      Cyanocobalamin (VITAMIN B 12 PO) Take 500 mg by mouth daily.     levETIRAcetam (KEPPRA) 500 MG tablet TAKE 1 TABLET(500 MG) BY MOUTH TWICE DAILY 180 tablet 0   levothyroxine (SYNTHROID) 50 MCG tablet TAKE 1 TABLET(50 MCG) BY MOUTH DAILY. 90 tablet 3   rosuvastatin (CRESTOR) 5 MG tablet TAKE 1 TABLET(5 MG) BY MOUTH DAILY 90 tablet 2   vitamin E 400 UNIT capsule Take 400 Units by mouth daily.      No current facility-administered medications on file prior to visit.   Past Medical History:  Diagnosis Date   Allergy    Arthritis    hands   Blood transfusion without reported diagnosis 1963   with child birth   Cancer [facility])    uterine cancer - age 88    Cataract    small, watching    GERD (gastroesophageal reflux disease)    Heart murmur    with pregnancy- none noted after pregnancies    Hyperlipidemia    Osteopenia    gets prolia every 6 months    Stroke  ([place])    TIA x 3 per pt- 05-2019 last episode - per note in care every where/ neuro note not CVA but old infarts noted on testing    Thyroid disease    No Known Allergies  Social History   Socioeconomic History   Marital status: Married    Spouse name: Not on file   Number of children: 2   Years of education: Not on file   Highest education level: High school graduate  Occupational History   Not on file  Tobacco Use   Smoking status: Never   Smokeless tobacco: Never  Vaping Use   Vaping Use: Never used  Substance and Sexual Activity  Alcohol use: No   Drug use: No   Sexual activity: Never  Other Topics Concern   Not on file  Social History Narrative   Social Review      Patient lives at home with.SPOUSE      Pt lives in a one or two story home? 2 STORY HOME      Does patient lives in a facility? If so where? PRIVATE HOME      What is patient highest level of education? HIGH SCHOOL GRADUATE      Is patient RIGHT or LEFT handed? RIGHT HANDED      Does patient drink coffee, tea, soda? YES      How much coffee, tea, soda does patient drink? SODA SOMETIMES, COFFEE ONCE A WEEK      Does patient exercise regularly? YES SHE HAS EXERCISE BIKE IN HER HOME      Drinks one coke every 2 days    Lives in a 2 story home   Social Determinants of Health   Financial Resource Strain: Not on file  Food Insecurity: Not on file  Transportation Needs: Not on file  Physical Activity: Not on file  Stress: Not on file  Social Connections: Not on file   Vitals:   12/24/22 1518  BP: 128/80  Pulse: 95  Resp: 16  Temp: 98.7 F (37.1 C)  SpO2: 97%   Body mass index is 25.32 kg/m.  Physical Exam Vitals and nursing note reviewed.  Constitutional:      General: She is not in acute distress.    Appearance: She is well-developed.  HENT:     Head: Normocephalic and atraumatic.     Mouth/Throat:     Mouth: Mucous membranes are moist.  Eyes:     Conjunctiva/sclera:  Conjunctivae normal.  Cardiovascular:     Rate and Rhythm: Normal rate and regular rhythm.     Pulses:          Dorsalis pedis pulses are 2+ on the right side and 2+ on the left side.     Heart sounds: No murmur heard. Pulmonary:     Effort: Pulmonary effort is normal. No respiratory distress.     Breath sounds: Normal breath sounds.  Abdominal:     Palpations: Abdomen is soft. There is no hepatomegaly or mass.     Tenderness: There is no abdominal tenderness.  Skin:    General: Skin is warm.     Findings: Rash (Confluent on left upper check, left periumbilical,and bilateral upper back. Not tender.) present. No erythema. Rash is papular. Rash is not vesicular.  Neurological:     General: No focal deficit present.     Mental Status: She is alert and oriented to person, place, and time.     Cranial Nerves: No cranial nerve deficit.     Gait: Gait normal.  Psychiatric:        Mood and Affect: Mood and affect normal.   ASSESSMENT AND PLAN:  Allison.Annajulia was seen today for hospitalization follow-up.  Diagnoses and all orders for this visit:  Transient alteration of awareness Assessment & Plan: Recurrent episodes, last one on 12/18/2022, for which she was admitted for 24 hours. We discussed possible etiologies,?  Seizure versus TIA. Continue rosuvastatin, Aspirin 81 mg daily, and Keppra. Instructed to avoid driving or activities that increase the risk of injury. I do not think further workup is needed at this time. Instructed about warning signs.   Mixed hyperlipidemia Assessment & Plan: Continue rosuvastatin  5 mg daily and low-fat diet.   Seizure [facility]) Assessment & Plan: Stressed the importance of compliance with medication. Continue Keppra 500 mg twice daily. She will keep appointment with Dr. Tomi Likens. Instructed to avoid driving until she sees neurologist.   Pruritic erythematous rash Assessment & Plan: We discussed possible etiologies. ?  Eczema. Recommend topical  triamcinolone cream twice daily for 14 days then as needed. If problem gets worse or does not resolve, dermatology referral will be the next step.  Orders: -     Triamcinolone Acetonide; Apply 1 Application topically 2 (two) times daily as needed.  Dispense: 30 g; Refill: 1  Acquired hypothyroidism Assessment & Plan: TSH 4.0 in 12/2022. Continue levothyroxine 50 mcg daily.   Chronic cerebrovascular accident (CVA) Assessment & Plan: Last LDL was 76. Continue rosuvastatin 5 mg daily and Aspirin 81 mg daily. Higher doses of rosuvastatin have caused myalgias in the past.    I spent a total of 41 minutes in both face to face and non face to face activities for this visit on the date of this encounter. During this time history was obtained and documented, examination was performed, prior labs/imaging reviewed, and assessment/plan discussed.  Return if symptoms worsen or fail to improve, for keep next appointment.  Charde Macfarlane G. Martinique, MD  [facility]. [place] office.

## 2022-12-24 ENCOUNTER — Ambulatory Visit (INDEPENDENT_AMBULATORY_CARE_PROVIDER_SITE_OTHER): Payer: Medicare Other | Admitting: Family Medicine

## 2022-12-24 ENCOUNTER — Encounter: Payer: Self-pay | Admitting: Family Medicine

## 2022-12-24 VITALS — BP 128/80 | HR 95 | Temp 98.7°F | Resp 16 | Ht 59.0 in | Wt 125.4 lb

## 2022-12-24 DIAGNOSIS — L298 Other pruritus: Secondary | ICD-10-CM | POA: Diagnosis not present

## 2022-12-24 DIAGNOSIS — E782 Mixed hyperlipidemia: Secondary | ICD-10-CM

## 2022-12-24 DIAGNOSIS — R404 Transient alteration of awareness: Secondary | ICD-10-CM

## 2022-12-24 DIAGNOSIS — R569 Unspecified convulsions: Secondary | ICD-10-CM

## 2022-12-24 DIAGNOSIS — E039 Hypothyroidism, unspecified: Secondary | ICD-10-CM

## 2022-12-24 DIAGNOSIS — Z8673 Personal history of transient ischemic attack (TIA), and cerebral infarction without residual deficits: Secondary | ICD-10-CM

## 2022-12-24 MED ORDER — TRIAMCINOLONE ACETONIDE 0.1 % EX CREA: 1.0000 | TOPICAL_CREAM | Freq: Two times a day (BID) | CUTANEOUS | 1 refills | Status: AC | PRN

## 2022-12-24 NOTE — Assessment & Plan Note (Addendum)
Last LDL was 76. Continue rosuvastatin 5 mg daily and Aspirin 81 mg daily. Higher doses of rosuvastatin have caused myalgias in the past.

## 2022-12-24 NOTE — Assessment & Plan Note (Signed)
We discussed possible etiologies. ?  Eczema. Recommend topical triamcinolone cream twice daily for 14 days then as needed. If problem gets worse or does not resolve, dermatology referral will be the next step.

## 2022-12-24 NOTE — Patient Instructions (Signed)
A few things to remember from today's visit:  Seizure ([place])  Mixed hyperlipidemia  Pruritic erythematous rash - Plan: triamcinolone cream (KENALOG) 0.1 %  Acquired hypothyroidism  Chronic cerebrovascular accident (CVA)  No changes today. Avoid driving until you see your neurologist.  If you need refills for medications you take chronically, please call your pharmacy. Do not use My Chart to request refills or for acute issues that need immediate attention. If you send a my chart message, it may take a few days to be addressed, specially if I am not in the office.  Please be sure medication list is accurate. If a new problem present, please set up appointment sooner than planned today.

## 2022-12-24 NOTE — Assessment & Plan Note (Addendum)
Recurrent episodes, last one on 12/18/2022, for which she was admitted for 24 hours. We discussed possible etiologies,?  Seizure versus TIA. Continue rosuvastatin, Aspirin 81 mg daily, and Keppra. Instructed to avoid driving or activities that increase the risk of injury. I do not think further workup is needed at this time. Instructed about warning signs.

## 2022-12-24 NOTE — Assessment & Plan Note (Signed)
Continue rosuvastatin 5 mg daily and low-fat diet.

## 2022-12-24 NOTE — Assessment & Plan Note (Signed)
Stressed the importance of compliance with medication. Continue Keppra 500 mg twice daily. She will keep appointment with Dr. Tomi Likens. Instructed to avoid driving until she sees neurologist.

## 2022-12-24 NOTE — Assessment & Plan Note (Signed)
TSH 4.0 in 12/2022. Continue levothyroxine 50 mcg daily.

## 2023-01-02 ENCOUNTER — Other Ambulatory Visit: Payer: Self-pay | Admitting: Neurology

## 2023-01-02 DIAGNOSIS — R569 Unspecified convulsions: Secondary | ICD-10-CM

## 2023-01-17 ENCOUNTER — Ambulatory Visit: Payer: Medicare Other | Admitting: Internal Medicine

## 2023-01-17 NOTE — Progress Notes (Deleted)
Patient ID: Allison Roberson, female   DOB: July 19, 1941, 82 y.o.   MRN: [id number]  HPI  Allison Roberson is a 82 y.o.-year-old female, initially referred by her PCP, Dr. Martinique, returning for follow-up for osteoporosis and high vitamin D.  Last visit 1 year ago.  Interim history:  No fractures or falls since last visit. She denies dizziness/vertigo/orthostasis/poor vision.  She recently had a new seizure that was in the ED 12/18/2022.  Reviewed and addended history: Pt was dx with osteoporosis in 11/2020.  I reviewed pt's available DXA scan report: Date L1-L4 T score FN T score 33% distal Radius  [facility]15/2020 ([facility]) n/a RFN: -2.3 LFN: -1.9 -4.4  11/29/2014 ([facility]) -0.8 RFN: n/a LFN: -1.9 n/a  09/24/2012 ([facility]) -1.3 RFN: n/a LFN: -2.3 n/a   Previous OP treatments:  -Bisphosphonates per chart - pt. does not recall -Prolia x 5.5 years - last inj. 2019 -I suggested to start Prolia again in 2022, but this was too expensive.  No h/o vitamin D deficiency. Reviewed available vit D levels -latest level was elevated.  She did not return for repeat vitamin D level as advised: Lab Results  Component Value Date   VD25OH >120 01/11/2022   VD25OH 59.5 01/10/2021   VD25OH 58.13 12/09/2019   VD25OH 58.02 02/17/2018   At last visit she was on: - vitamin D 10,000 units daily She previously stopped MVI per neurology advice. I advised her to stop the vitamin D after the results returned in 12/2021.  No weight bearing exercises.  She uses an exercise bike at home.  She does not take high vitamin A doses.  No smoking, no alcohol use, no history of steroid use.  No RA.  Menopause was at 82 y/o, but she had hysterectomy at 81 years old for uterine cancer.  Unknown family history of osteoporosis. Grew up separated from her family.  No h/o persistent hyper/hypocalcemia or hyperparathyroidism. No h/o kidney stones. Lab Results  Component Value Date   PTH 24 02/17/2018    CALCIUM 10.1 01/11/2022   CALCIUM 9.7 01/10/2021   CALCIUM 9.6 06/19/2020   CALCIUM 10.9 (H) 12/09/2019   CALCIUM 10.4 02/17/2018   She also has hypothyroidism:  She is on LT4 50 mcg daily: - in am - fasting - at least 30 min from b'fast if she eats b'fast at all - + calcium at night - no iron - no multivitamins - no PPIs - not on Biotin  Reviewed TSH recent levels:   Ref Range & Units 12/18/2022   TSH 0.45 - 5.33 UIU/ML 4.00  Resulting Agency  [facility]   Lab Results  Component Value Date   TSH 7.25 (H) 09/10/2022   TSH 2.50 01/11/2022   TSH 2.56 12/13/2020   TSH 1.83 12/09/2019   TSH 2.35 02/17/2018   No h/o CKD. Last BUN/Cr: Lab Results  Component Value Date   BUN 13 01/11/2022   CREATININE 0.79 01/11/2022   She has a history of seizures after a stroke and sees neurology.  She also has a history of hiatal hernia per swallow study.  Husband has dementia.  ROS: + see HPI  + dysphagia + itching   I reviewed pt's medications, allergies, PMH, social hx, family hx, and changes were documented in the history of present illness. Otherwise, unchanged from my initial visit note.  Past Medical History:  Diagnosis Date   Allergy    Arthritis    hands   Blood transfusion  without reported diagnosis 1963   with child birth   Cancer [facility])    uterine cancer - age 82    Cataract    small, watching    GERD (gastroesophageal reflux disease)    Heart murmur    with pregnancy- none noted after pregnancies    Hyperlipidemia    Osteopenia    gets prolia every 6 months    Stroke [facility])    TIA x 3 per pt- 05-2019 last episode - per note in care every where/ neuro note not CVA but old infarts noted on testing    Thyroid disease    Past Surgical History:  Procedure Laterality Date   ABDOMINAL HYSTERECTOMY  1969   COLONOSCOPY  08/05/2006   tic, moderate    PARTIAL HYSTERECTOMY     has ovaries   POLYPECTOMY     Social History   Socioeconomic  History   Marital status: Married    Spouse name: Not on file   Number of children: 2   Years of education: Not on file   Highest education level: High school graduate  Occupational History   Not on file  Tobacco Use   Smoking status: Never   Smokeless tobacco: Never  Vaping Use   Vaping Use: Never used  Substance and Sexual Activity   Alcohol use: No   Drug use: No   Sexual activity: Never  Other Topics Concern   Not on file  Social History Narrative   Social Review      Patient lives at home with.SPOUSE      Pt lives in a one or two story home? 2 STORY HOME      Does patient lives in a facility? If so where? PRIVATE HOME      What is patient highest level of education? HIGH SCHOOL GRADUATE      Is patient RIGHT or LEFT handed? RIGHT HANDED      Does patient drink coffee, tea, soda? YES      How much coffee, tea, soda does patient drink? SODA SOMETIMES, COFFEE ONCE A WEEK      Does patient exercise regularly? YES SHE HAS EXERCISE BIKE IN HER HOME      Drinks one coke every 2 days    Lives in a 2 story home   Social Determinants of Health   Financial Resource Strain: Not on file  Food Insecurity: Not on file  Transportation Needs: Not on file  Physical Activity: Not on file  Stress: Not on file  Social Connections: Not on file  Intimate Partner Violence: Not on file   Current Outpatient Medications on File Prior to Visit  Medication Sig Dispense Refill   Ascorbic Acid (VITAMIN C) 1000 MG tablet Take 1,000 mg by mouth daily.      aspirin 81 MG chewable tablet Chew 81 mg by mouth daily.      Cholecalciferol (VITAMIN D3) 400 units CAPS Take 400 Units by mouth daily.      Cyanocobalamin (VITAMIN B 12 PO) Take 500 mg by mouth daily.     levETIRAcetam (KEPPRA) 500 MG tablet TAKE 1 TABLET(500 MG) BY MOUTH TWICE DAILY 120 tablet 0   levothyroxine (SYNTHROID) 50 MCG tablet TAKE 1 TABLET(50 MCG) BY MOUTH DAILY. 90 tablet 3   rosuvastatin (CRESTOR) 5 MG tablet TAKE 1  TABLET(5 MG) BY MOUTH DAILY 90 tablet 2   triamcinolone cream (KENALOG) 0.1 % Apply 1 Application topically 2 (two) times daily as needed. 30 g 1  vitamin E 400 UNIT capsule Take 400 Units by mouth daily.      No current facility-administered medications on file prior to visit.   No Known Allergies Family History  Problem Relation Age of Onset   Colon polyps Son    Colon cancer Neg Hx    Rectal cancer Neg Hx    Stomach cancer Neg Hx    Pancreatic cancer Neg Hx    Esophageal cancer Neg Hx     PE: There were no vitals taken for this visit. Wt Readings from Last 3 Encounters:  12/24/22 125 lb 6 oz (56.9 kg)  09/10/22 119 lb (54 kg)  01/11/22 118 lb 3.2 oz (53.6 kg)   Constitutional: Normal weight, in NAD. No kyphosis. Eyes: PERRLA, EOMI, no exophthalmos ENT: no thyromegaly, no cervical lymphadenopathy Cardiovascular: RRR, No MRG, + slight periankle swelling in the left Respiratory: CTA B Musculoskeletal: no deformities Skin: no rashes Neurological: no tremor with outstretched hands  Assessment: 1. Osteoporosis  2. Hypothyroidism  Plan: 1. 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  Reviewed the latest 3 bone density scans together and I explained that based on the T-scores she has an increased risk of fractures.  Her lowest T score is at the 73% distal radius per review of the report from 2021.  I do not have the full previous reports , but there has been significant improvement in her bone density between 2013 and 2015, most likely due to being on Prolia at that time.  She describes that she stopped Prolia at the end of 2019 (but she was not completely sure about this) and it was difficult for her to travel from [place] to [place] to have the injection as she needed to take care of her husband, who had dementia.  She also did not continue with other antiresorptive medications  after Prolia.  At last visit, I suggested to start this again and she agreed with this, but unfortunately she had a high co-pay and could not proceed with it. -At last visit, vitamin D level was normal so we continued her vitamin D supplement of 10,000 units daily.  We will repeat this today. -We again discussed fall precautions. -At last visit I recommended weightbearing exercises 5 out of 7 days-given National osteoporosis foundation recommendations -I also recommended [facility] for skeletal loading.  I gave her a brochure and explained the benefits.  She did not start this as she does not leave in [place] and was also very busy since last visit.  At that time we also discussed about best diet for osteoporosis. -At today's visit, we will check another BMP and vitamin D level.  -After the above results returned, we plan to re-apply for Prolia, but I advised her to let me know if we cannot start this, in which case, we should start Reclast.  I explained how this is different than Prolia and also the schedule.  We most likely can continue with this for 3 to 5 years and then take a drug holiday.  -She is due for another bone density scan-we will order -I will see her back in 1 year  2. Hypothyroidism - thyroid labs reviewed with pt. >> TSH was elevated 4 months ago: Lab Results  Component Value Date   TSH 7.25 (H) 09/10/2022  - However, at that time, per review of notes from PCP, she was off LT4 50 mcg  daily for few weeks.  This was restarted. -Latest TSH obtained a month ago was normal: TSH 4.0 - we discussed about taking the thyroid hormone every day, with water, >30 minutes before breakfast, separated by >4 hours from acid reflux medications, calcium, iron, multivitamins. Pt. is taking it correctly.  Philemon Kingdom, MD PhD [facility] Endocrinology

## 2023-02-25 NOTE — Progress Notes (Unsigned)
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 Check levetiracetam level Follow up one year  Subjective:  Allison Roberson is a 82 year old right-handed white female with hyperlipidemia, TMJ dysfunction, thyroid disease and remote history of uterine cancer who follows up for seizures.   UPDATE: Current medications:  Keppra '500mg'$  twice daily.   Last seizure:  12/18/2022 On 12/18/2022, she appeared by her daughter to be disoriented.  At breakfast, she was "zoning out".  She apparently had self-decreased her Keppra from '500mg'$  BID to '750mg'$  daily on Christmas due to development of a rash which she attributed to the medication.  CT perfusion and CTA head and neck revealed no acute intracranial abnormality, LVO or hemodynamically significant stenosis.  MRI of brain revealed no acute intracranial abnormality.  Chemistry was within normal limits.  Glucose 122 and negative UA.  She was restarted on Keppra '500mg'$  twice daily.  She states she is taking it as prescribed.  However, she maintains that she didn't have a seizure.  Instead, she was upset about her daughter who has ESRD on HD and is waiting for a new kidney.   HISTORY: On 05/20/2019, she was sleeping in bed when her husband heard change in breathing.  He was unable to wake her up and he couldn't.  No associated shaking, incontinence or tongue biting.  EMS was contacted and she was brought to [facility].  In the ED, she appeared confused and did not remember the episode.  CBC, electrolytes, troponin and EKG were unremarkable.  She was admitted for further evaluation.  She had an apnea link performed which did not show any episodes of apnea but her O2 saturation dropped below 80% several times.  CT chest showed questionable atelectasis versus infiltrate in  upper lobes.  She did not exhibit any fever or upper respiratory symptoms but was started on empiric IV antibiotics and Lasix.  She demonstrated no further nocturnal hypoxia.  CT of head was negative for acute intracranial abnormality.  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  No definitive diagnosis was established.  She was discharged with home oxygen and recommendation for outpatient sleep study and neurology follow up.  Routine EEG on 10/19/2019 showed intermittent left temporal sharply-contoured slowing but no clear epileptiform discharges.  A 24 hour ambulatory EEG from 10/28/2019 to 10/29/2019 was normal.    She was admitted to [facility] on 06/19/2020 after she was found down by her husband unresponsive and not breathing.  He started CPR and 911 was called.  She was awake once EMS arrived.  MRI of brain was limited due to motion artifact but chronic small vessel ischemic changes noted and no acute abnormality.  EEG was unremarkable.  Started on Keppra.    She was off AED for several weeks when she had recurrent seizure-like spell.  On 09/12/2020, she was eating dinner with family when she started playing with her food and was aphasic.  She was admitted to [facility].  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  EEG was normal.    Past medications:  Vimpat, oxcarbazepine, zonisamide (side effects)  PAST  MEDICAL HISTORY: Past Medical History:  Diagnosis Date   Allergy    Arthritis    hands   Blood transfusion without reported diagnosis 1963   with child birth   Cancer [facility])    uterine cancer - age 68    Cataract    small, watching    GERD (gastroesophageal reflux disease)    Heart murmur    with pregnancy- none noted after  pregnancies    Hyperlipidemia    Osteopenia    gets prolia every 6 months    Stroke [facility])    TIA x 3 per pt- 05-2019 last episode - per note in care every where/ neuro note not CVA but old infarts noted on testing    Thyroid disease     MEDICATIONS: Current Outpatient Medications on File Prior to Visit  Medication Sig Dispense Refill   Ascorbic Acid (VITAMIN C) 1000 MG tablet Take 1,000 mg by mouth daily.      aspirin 81 MG chewable tablet Chew 81 mg by mouth daily.      Cholecalciferol (VITAMIN D3) 400 units CAPS Take 400 Units by mouth daily.      Cyanocobalamin (VITAMIN B 12 PO) Take 1,000 mg by mouth daily.     Glucosamine-Chondroitin (GLUCOSAMINE CHONDR COMPLEX PO) Take 1 tablet by mouth at bedtime.     levETIRAcetam (KEPPRA) 500 MG tablet Take 500 mg by mouth 2 (two) times daily.     levothyroxine (SYNTHROID) 50 MCG tablet Take 1 tablet (50 mcg total) by mouth daily. 90 tablet 3   OXcarbazepine (TRILEPTAL) 150 MG tablet Take 1 tablet (150 mg total) by mouth 2 (two) times daily. Take  '150mg'$  twice daily for one week, then '300mg'$  twice daily. (Patient not taking: Reported on 04/28/2021) 90 tablet 3   rosuvastatin (CRESTOR) 5 MG tablet TAKE 1 TABLET(5 MG) BY MOUTH DAILY 30 tablet 3   vitamin E 400 UNIT capsule Take 400 Units by mouth daily.      No current facility-administered medications on file prior to visit.    ALLERGIES: No Known Allergies  FAMILY HISTORY: Family History  Problem Relation Age of Onset   Colon polyps Son    Colon cancer Neg Hx    Rectal cancer Neg Hx    Stomach cancer Neg Hx    Pancreatic cancer Neg Hx    Esophageal cancer Neg Hx       Objective:  Blood pressure (!) 152/71, pulse 77, height '4\' 10"'$  (1.473 m), weight 125 lb (56.7 kg), SpO2 97 %. General: No acute distress.  Patient appears well-groomed.   Head:  Normocephalic/atraumatic Eyes:  Fundi examined but not visualized Neck: supple, no paraspinal tenderness, full range of motion Heart:   Regular rate and rhythm Neurological Exam: alert and oriented to person, place, and time.  Speech fluent and not dysarthric, language intact.  CN II-XII intact. Bulk and tone normal, muscle strength 5/5 throughout.  Sensation to light touch intact.  Deep tendon reflexes 2+ throughout.  Finger to nose testing intact.  Gait normal, Romberg negative.   Metta Clines, DO  CC: Betty Martinique, MD

## 2023-02-26 ENCOUNTER — Ambulatory Visit: Payer: Medicare Other | Admitting: Neurology

## 2023-02-26 ENCOUNTER — Other Ambulatory Visit (INDEPENDENT_AMBULATORY_CARE_PROVIDER_SITE_OTHER): Payer: Medicare Other

## 2023-02-26 ENCOUNTER — Encounter: Payer: Self-pay | Admitting: Neurology

## 2023-02-26 VITALS — BP 152/71 | HR 77 | Ht <= 58 in | Wt 125.0 lb

## 2023-02-26 DIAGNOSIS — R569 Unspecified convulsions: Secondary | ICD-10-CM | POA: Diagnosis not present

## 2023-02-26 DIAGNOSIS — Z79899 Other long term (current) drug therapy: Secondary | ICD-10-CM | POA: Diagnosis not present

## 2023-02-26 MED ORDER — LEVETIRACETAM 500 MG PO TABS: ORAL_TABLET | ORAL | 3 refills | Status: DC

## 2023-02-26 NOTE — Addendum Note (Signed)
Addended by: Venetia Night on: 02/26/2023 02:02 PM   Modules accepted: Orders

## 2023-02-26 NOTE — Patient Instructions (Signed)
Continue levetiracetam '500mg'$  twice daily Check levetiracetam level Follow up one year

## 2023-03-06 LAB — LEVETIRACETAM LEVEL: Keppra (Levetiracetam): 36.3 ug/mL

## 2023-03-25 ENCOUNTER — Telehealth: Payer: Self-pay | Admitting: Family Medicine

## 2023-03-25 NOTE — Telephone Encounter (Signed)
Called Pt to schedule a AWV.    Pt stated her daughter is in the hospital and she "does not have time for that crap"   Pt asked that she be taken off the list.

## 2023-04-01 NOTE — Telephone Encounter (Signed)
Documented on worksheet do not call

## 2023-06-13 ENCOUNTER — Other Ambulatory Visit: Payer: Self-pay | Admitting: Family Medicine

## 2023-09-11 ENCOUNTER — Other Ambulatory Visit: Payer: Self-pay | Admitting: Family Medicine

## 2023-09-11 DIAGNOSIS — E039 Hypothyroidism, unspecified: Secondary | ICD-10-CM

## 2023-12-13 ENCOUNTER — Other Ambulatory Visit: Payer: Self-pay | Admitting: Family Medicine

## 2023-12-13 DIAGNOSIS — E039 Hypothyroidism, unspecified: Secondary | ICD-10-CM

## 2024-02-25 NOTE — Progress Notes (Deleted)
 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 *** Follow up one year  Subjective:  Allison Roberson is a 83 year old right-handed white female with hyperlipidemia, TMJ dysfunction, thyroid disease and remote history of uterine cancer who follows up for seizures.   UPDATE: Current medications:  Keppra 500mg  twice daily.   Last seizure:  12/18/2022 (insetting of Keppra noncompliance).  Restarted Keppra.  Keppra level on 02/26/2023 was 36.3.    HISTORY: On 05/20/2019, she was sleeping in bed when her husband heard change in breathing.  He was unable to wake her up and he couldn't.  No associated shaking, incontinence or tongue biting.  EMS was contacted and she was brought to [facility].  In the ED, she appeared confused and did not remember the episode.  CBC, electrolytes, troponin and EKG were unremarkable.  She was admitted for further evaluation.  She had an apnea link performed which did not show any episodes of apnea but her O2 saturation dropped below 80% several times.  CT chest showed questionable atelectasis versus infiltrate in upper lobes.  She did not exhibit any fever or upper respiratory symptoms but was started on empiric IV antibiotics and Lasix.  She demonstrated no further nocturnal hypoxia.  CT of head was negative for acute intracranial abnormality.  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  No definitive diagnosis was established.  She was discharged with home oxygen and recommendation for outpatient sleep study and neurology follow up.  Routine EEG on 10/19/2019 showed intermittent left temporal sharply-contoured slowing but no clear  epileptiform discharges.  A 24 hour ambulatory EEG from 10/28/2019 to 10/29/2019 was normal.    She was admitted to [facility] on 06/19/2020 after she was found down by her husband unresponsive and not breathing.  He started CPR and 911 was called.  She was awake once EMS arrived.  MRI of brain was limited due to motion artifact but chronic small vessel ischemic changes noted and no acute abnormality.  EEG was unremarkable.  Started on Keppra.    She was off AED for several weeks when she had recurrent seizure-like spell.  On 09/12/2020, she was eating dinner with family when she started playing with her food and was aphasic.  She was admitted to [facility].  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  EEG was normal.   She had another seizure on 12/18/2022, in which she appeared by her daughter to be disoriented.  At breakfast, she was "zoning out".  She apparently had self-decreased her Keppra from 500mg  BID to 750mg  daily on Christmas due to development of a rash which she attributed to the medication.  CT perfusion and CTA head and neck revealed no acute intracranial abnormality, LVO or hemodynamically significant stenosis.  MRI of brain revealed no acute intracranial abnormality.  Chemistry was within normal limits.  Glucose 122 and negative UA.  She was restarted on Keppra 500mg  twice daily.  She states she is taking it as prescribed.  However, she maintains that she didn't have a seizure.  Instead, she was upset about her daughter who has ESRD on  HD and is waiting for a new kidney.   Past medications:  Vimpat, oxcarbazepine, zonisamide (side effects)  PAST MEDICAL HISTORY: Past Medical History:  Diagnosis Date   Allergy    Arthritis    hands   Blood transfusion without reported diagnosis 1963   with child birth   Cancer [facility])    uterine cancer - age 89    Cataract    small,  watching    GERD (gastroesophageal reflux disease)    Heart murmur    with pregnancy- none noted after pregnancies    Hyperlipidemia    Osteopenia    gets prolia every 6 months    Stroke [facility])    TIA x 3 per pt- 05-2019 last episode - per note in care every where/ neuro note not CVA but old infarts noted on testing    Thyroid disease     MEDICATIONS: Current Outpatient Medications on File Prior to Visit  Medication Sig Dispense Refill   Ascorbic Acid (VITAMIN C) 1000 MG tablet Take 1,000 mg by mouth daily.      aspirin 81 MG chewable tablet Chew 81 mg by mouth daily.      Cholecalciferol (VITAMIN D3) 400 units CAPS Take 400 Units by mouth daily.      Cyanocobalamin (VITAMIN B 12 PO) Take 500 mg by mouth daily.     levETIRAcetam (KEPPRA) 500 MG tablet TAKE 1 TABLET(500 MG) BY MOUTH TWICE DAILY 180 tablet 3   levothyroxine (SYNTHROID) 50 MCG tablet TAKE 1 TABLET(50 MCG) BY MOUTH DAILY 90 tablet 0   rosuvastatin (CRESTOR) 5 MG tablet TAKE 1 TABLET(5 MG) BY MOUTH DAILY 90 tablet 3   triamcinolone cream (KENALOG) 0.1 % Apply 1 Application topically 2 (two) times daily as needed. (Patient not taking: Reported on 02/26/2023) 30 g 1   vitamin E 400 UNIT capsule Take 400 Units by mouth daily.      No current facility-administered medications on file prior to visit.    ALLERGIES: No Known Allergies  FAMILY HISTORY: Family History  Problem Relation Age of Onset   Colon polyps Son    Colon cancer Neg Hx    Rectal cancer Neg Hx    Stomach cancer Neg Hx    Pancreatic cancer Neg Hx    Esophageal cancer Neg Hx       Objective:  *** General: No acute distress.  Patient appears well-groomed.   ***   Shon Millet, DO  CC: Betty [place], MD

## 2024-02-26 ENCOUNTER — Ambulatory Visit: Payer: Medicare Other | Admitting: Neurology

## 2024-03-03 ENCOUNTER — Other Ambulatory Visit: Payer: Self-pay | Admitting: Neurology

## 2024-03-03 DIAGNOSIS — R569 Unspecified convulsions: Secondary | ICD-10-CM

## 2024-03-03 NOTE — Telephone Encounter (Signed)
 Patient last seen 02/26/23, Front desk please call patient to schedule a follow up to get further refills.

## 2024-03-04 NOTE — Telephone Encounter (Signed)
 Tried calling both Pt. number and Spouse and they are both not in service.

## 2024-03-09 ENCOUNTER — Other Ambulatory Visit: Payer: Self-pay | Admitting: Neurology

## 2024-03-09 ENCOUNTER — Other Ambulatory Visit: Payer: Self-pay

## 2024-03-09 ENCOUNTER — Telehealth: Payer: Self-pay | Admitting: Neurology

## 2024-03-09 DIAGNOSIS — R569 Unspecified convulsions: Secondary | ICD-10-CM

## 2024-03-09 MED ORDER — LEVETIRACETAM 500 MG PO TABS: ORAL_TABLET | ORAL | 3 refills | Status: AC

## 2024-03-09 NOTE — Telephone Encounter (Signed)
Sent to Dr.Jaffe to sign  

## 2024-03-09 NOTE — Telephone Encounter (Signed)
 Patient needs to have her Keppra refilled she would like it called in to the [facility] in [place]   She has appt on 09-14-24 with Everlena Cooper and is on the wait list

## 2024-03-16 ENCOUNTER — Other Ambulatory Visit: Payer: Self-pay | Admitting: Family Medicine

## 2024-03-16 DIAGNOSIS — E039 Hypothyroidism, unspecified: Secondary | ICD-10-CM

## 2024-04-16 ENCOUNTER — Other Ambulatory Visit: Payer: Self-pay | Admitting: Family Medicine

## 2024-04-16 DIAGNOSIS — E039 Hypothyroidism, unspecified: Secondary | ICD-10-CM

## 2024-04-17 ENCOUNTER — Other Ambulatory Visit: Payer: Self-pay | Admitting: Family Medicine

## 2024-04-17 DIAGNOSIS — E039 Hypothyroidism, unspecified: Secondary | ICD-10-CM

## 2024-07-15 ENCOUNTER — Encounter: Payer: Self-pay | Admitting: Neurology

## 2024-09-10 ENCOUNTER — Ambulatory Visit: Admitting: Neurology

## 2024-09-14 ENCOUNTER — Ambulatory Visit: Admitting: Neurology

## 2024-09-17 ENCOUNTER — Ambulatory Visit: Admitting: Neurology
